# Patient Record
Sex: Male | Born: 1995 | Race: White | Hispanic: No | Marital: Single | Smoking: Never smoker
Health system: Southern US, Community
[De-identification: ages and names within clinical notes are randomized; demographics above are authoritative.]

## PROBLEM LIST (undated history)

## (undated) HISTORY — PX: WISDOM TOOTH EXTRACTION: SHX21

## (undated) HISTORY — PX: TONSILLECTOMY: SUR1361

---
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
## 2014-07-26 ENCOUNTER — Encounter (HOSPITAL_COMMUNITY): Payer: Self-pay | Admitting: Emergency Medicine

## 2014-07-26 ENCOUNTER — Emergency Department (HOSPITAL_COMMUNITY): Payer: Medicaid Other

## 2014-07-26 ENCOUNTER — Emergency Department (HOSPITAL_COMMUNITY)
Admission: EM | Admit: 2014-07-26 | Discharge: 2014-07-26 | Disposition: A | Discharge status: Home / Self Care | Payer: Medicaid Other | Attending: Emergency Medicine | Admitting: Emergency Medicine

## 2014-07-26 DIAGNOSIS — W2101XA Struck by football, initial encounter: Secondary | ICD-10-CM | POA: Diagnosis not present

## 2014-07-26 DIAGNOSIS — S62620A Displaced fracture of medial phalanx of right index finger, initial encounter for closed fracture: Secondary | ICD-10-CM | POA: Insufficient documentation

## 2014-07-26 DIAGNOSIS — Y9289 Other specified places as the place of occurrence of the external cause: Secondary | ICD-10-CM | POA: Diagnosis not present

## 2014-07-26 DIAGNOSIS — Z7982 Long term (current) use of aspirin: Secondary | ICD-10-CM | POA: Insufficient documentation

## 2014-07-26 DIAGNOSIS — Z79899 Other long term (current) drug therapy: Secondary | ICD-10-CM | POA: Diagnosis not present

## 2014-07-26 DIAGNOSIS — S62629A Displaced fracture of medial phalanx of unspecified finger, initial encounter for closed fracture: Secondary | ICD-10-CM

## 2014-07-26 DIAGNOSIS — Y9389 Activity, other specified: Secondary | ICD-10-CM | POA: Insufficient documentation

## 2014-07-26 DIAGNOSIS — Y998 Other external cause status: Secondary | ICD-10-CM | POA: Insufficient documentation

## 2014-07-26 DIAGNOSIS — S6991XA Unspecified injury of right wrist, hand and finger(s), initial encounter: Secondary | ICD-10-CM | POA: Diagnosis present

## 2014-07-26 NOTE — Discharge Instructions (Signed)

## 2014-07-26 NOTE — ED Notes (Signed)
Pt verbalizes understanding of d/c instructions and denies any further need at this time. 

## 2014-07-26 NOTE — ED Provider Notes (Signed)
CSN: [id number]     Arrival date & time 07/26/14  [id number] History   This chart was scribed for Roxy Horsemanobert Christipher Rieger, PA-C working with Cathren LaineKevin Steinl, MD by Evon Slackerrance Branch, ED Scribe. This patient was seen in room TR05C/TR05C and the patient's care was started at 6:46 PM.      Chief Complaint  Patient presents with  . Finger Injury   The history is provided by the patient. No language interpreter was used.   HPI Comments: Gabriel Finley is a 19 y.o. male who presents to the Emergency Department complaining of right index finger onset today. Pt states that he injured the finger today when catching a football. Pt doesn't report any medications PTA. Pt states that the pain is worse with movement. Pt doesn't report numbness or tingling.   History reviewed. No pertinent past medical history. Past Surgical History  Procedure Laterality Date  . Tonsillectomy    . Wisdom tooth extraction     No family history on file. History  Substance Use Topics  . Smoking status: Never Smoker   . Smokeless tobacco: Not on file  . Alcohol Use: No    Review of Systems  Musculoskeletal: Positive for arthralgias.  Neurological: Negative for numbness.      Allergies  Red dye  Home Medications   Prior to Admission medications   Medication Sig Start Date End Date Taking? Authorizing Provider  acetaminophen (TYLENOL) 500 MG tablet Take 500 mg by mouth every 6 (six) hours as needed for pain.    Historical Provider, MD  aspirin 81 MG tablet Take 81 mg by mouth daily as needed for pain.    Historical Provider, MD  nadolol (CORGARD) 20 MG tablet Take 20 mg by mouth daily.    Historical Provider, MD  promethazine (PHENERGAN) 25 MG tablet Take 25 mg by mouth every 6 (six) hours as needed for nausea.    Historical Provider, MD  simethicone (MYLICON) 80 MG chewable tablet Chew 80 mg by mouth every 6 (six) hours as needed for flatulence.    Historical Provider, MD  SUMAtriptan (IMITREX) 25 MG tablet Take 25 mg by mouth  every 2 (two) hours as needed for migraine.    Historical Provider, MD   BP 123/62 mmHg  Pulse 88  Temp(Src) 98.3 F (36.8 C) (Oral)  Resp 18  Ht 6' (1.829 m)  Wt 284 lb 4.8 oz (128.958 kg)  BMI 38.55 kg/m2   Physical Exam  Constitutional: He is oriented to person, place, and time. He appears well-developed and well-nourished. No distress.  HENT:  Head: Normocephalic and atraumatic.  Eyes: Conjunctivae and EOM are normal.  Neck: Neck supple. No tracheal deviation present.  Cardiovascular: Normal rate and intact distal pulses.   Intact distal pulses and brisk capillary refill.   Pulmonary/Chest: Effort normal. No respiratory distress.  Musculoskeletal: Normal range of motion. He exhibits tenderness.  Right index finger midly tender to palpation at the PIP joint, ROM and strength 5/5.   Neurological: He is alert and oriented to person, place, and time.  Sensation intact.   Skin: Skin is warm and dry.  Psychiatric: He has a normal mood and affect. His behavior is normal.  Nursing note and vitals reviewed.   ED Course  Procedures (including critical care time) DIAGNOSTIC STUDIES:   COORDINATION OF CARE: 6:48 PM-Discussed treatment plan with pt at bedside and pt agreed to plan.     Labs Review Labs Reviewed - No data to display  Imaging Review Dg Finger  Index Right  07/26/2014   CLINICAL DATA:  Injured index finger catching football. Index finger pain. Initial encounter.  EXAM: RIGHT INDEX FINGER 2+V  COMPARISON:  None.  FINDINGS: A small ossific density is seen along the volar plate of the middle phalanx adjacent to the proximal interphalangeal joint, which is consistent with a volar plate avulsion fracture but is of indeterminate age radiographically. No other fractures are identified. No evidence of dislocation.  IMPRESSION: Volar plate avulsion fracture involving the middle phalanx adjacent to the PIP joint, which is of indeterminate age radiographically. Recommend clinical  correlation for point tenderness at this site.   Electronically Signed   By: Myles Rosenthal M.D.   On: 07/26/2014 19:25     EKG Interpretation None      MDM   Final diagnoses:  Avulsion fracture of middle phalanx of finger, closed, initial encounter   Finger splint.  F/u with ortho.  I personally performed the services described in this documentation, which was scribed in my presence. The recorded information has been reviewed and is accurate.       Roxy Horseman, PA-C 07/26/14 2216  Cathren Laine, MD 07/31/14 [phone]

## 2014-07-26 NOTE — ED Notes (Signed)
Pt reports he injured his R index finger catching a football today.

## 2016-01-25 ENCOUNTER — Emergency Department (HOSPITAL_COMMUNITY)
Admission: EM | Admit: 2016-01-25 | Discharge: 2016-01-26 | Disposition: A | Discharge status: Home / Self Care | Payer: Self-pay | Attending: Emergency Medicine | Admitting: Emergency Medicine

## 2016-01-25 ENCOUNTER — Emergency Department (HOSPITAL_COMMUNITY): Payer: Self-pay

## 2016-01-25 ENCOUNTER — Encounter (HOSPITAL_COMMUNITY): Payer: Self-pay

## 2016-01-25 DIAGNOSIS — Y92009 Unspecified place in unspecified non-institutional (private) residence as the place of occurrence of the external cause: Secondary | ICD-10-CM | POA: Insufficient documentation

## 2016-01-25 DIAGNOSIS — Z7982 Long term (current) use of aspirin: Secondary | ICD-10-CM | POA: Insufficient documentation

## 2016-01-25 DIAGNOSIS — Y9301 Activity, walking, marching and hiking: Secondary | ICD-10-CM | POA: Insufficient documentation

## 2016-01-25 DIAGNOSIS — Y999 Unspecified external cause status: Secondary | ICD-10-CM | POA: Insufficient documentation

## 2016-01-25 DIAGNOSIS — W228XXA Striking against or struck by other objects, initial encounter: Secondary | ICD-10-CM | POA: Insufficient documentation

## 2016-01-25 DIAGNOSIS — S060X0A Concussion without loss of consciousness, initial encounter: Secondary | ICD-10-CM | POA: Insufficient documentation

## 2016-01-25 DIAGNOSIS — S0003XA Contusion of scalp, initial encounter: Secondary | ICD-10-CM | POA: Insufficient documentation

## 2016-01-25 DIAGNOSIS — Z79899 Other long term (current) drug therapy: Secondary | ICD-10-CM | POA: Insufficient documentation

## 2016-01-25 MED ORDER — ACETAMINOPHEN 500 MG PO TABS
1000.0000 mg | ORAL_TABLET | Freq: Once | ORAL | Status: AC
  Administered 2016-01-25: 1000 mg via ORAL
  Filled 2016-01-25: qty 2

## 2016-01-25 MED ORDER — ONDANSETRON 4 MG PO TBDP
8.0000 mg | ORAL_TABLET | Freq: Once | ORAL | Status: AC
Start: 2016-01-25 — End: 2016-01-25
  Administered 2016-01-25: 8 mg via ORAL
  Filled 2016-01-25: qty 2

## 2016-01-25 NOTE — ED Notes (Addendum)
Patient brought in by mother for evaluation following pt hitting his head last night about 7pm on pantry door edge. Pt's mother sts he had a bump last night that has gone down in size today, small hematoma noted to top of R side of head. Mother sts pt has "not been acting himself, he's usually more energetic and he seems to be depressed today." C/o dizziness upon movement and nausea. Denies vomiting or LOC. A&O x 4.

## 2016-01-25 NOTE — ED Triage Notes (Signed)
Last night pt hit his head on the edge of a food pantry; has bruise to right side of head. Mom reports pt has been nauseous and more tired than normal and also reports headache since the head injury, "hes not his normal self."  A&OX4.

## 2016-01-25 NOTE — ED Provider Notes (Signed)
[facility]EMERGENCY DEPT Provider Note   CSN: [id number] Arrival date & time: 01/25/16  1546     History   Chief Complaint Chief Complaint  Patient presents with  . Head Injury    HPI Gabriel Finley is a 21 y.o. male.  HPI   Patient is a 21 year old male with no pertinent past medical history presents to the ED with complaint of head injury, onset yesterday afternoon. Patient reports when he was walking out of their storage closet under their stairs at home he hit his head on the metal cabinet shelf. Denies LOC. Patient reports having associated headache to the top of his head with small area of swelling to the top of his scalp. Patient states the headache has been constant pressure/tightness since onset and has gradually worsened this afternoon. Endorses associated nausea, fatigue and intermittent lightheadedness. Patient's mother reports he has seemed more tired and has been wanting to sleep during the day which is unusual for him. Patient reports taking Tylenol with mild intermittent improvement of symptoms. Denies fever, visual changes, neck pain/stiffness, abdominal pain, vomiting, numbness, tingling, weakness.   History reviewed. No pertinent past medical history.  There are no active problems to display for this patient.   Past Surgical History:  Procedure Laterality Date  . TONSILLECTOMY    . WISDOM TOOTH EXTRACTION         Home Medications    Prior to Admission medications   Medication Sig Start Date End Date Taking? Authorizing Provider  acetaminophen (TYLENOL) 500 MG tablet Take 500 mg by mouth every 6 (six) hours as needed for pain.    Historical Provider, MD  aspirin 81 MG tablet Take 81 mg by mouth daily as needed for pain.    Historical Provider, MD  nadolol (CORGARD) 20 MG tablet Take 20 mg by mouth daily.    Historical Provider, MD  promethazine (PHENERGAN) 25 MG tablet Take 25 mg by mouth every 6 (six) hours as needed for nausea.    Historical Provider, MD    simethicone (MYLICON) 80 MG chewable tablet Chew 80 mg by mouth every 6 (six) hours as needed for flatulence.    Historical Provider, MD  SUMAtriptan (IMITREX) 25 MG tablet Take 25 mg by mouth every 2 (two) hours as needed for migraine.    Historical Provider, MD    Family History No family history on file.  Social History Social History  Substance Use Topics  . Smoking status: Never Smoker  . Smokeless tobacco: Never Used  . Alcohol use No     Allergies   Red dye   Review of Systems Review of Systems  Constitutional: Positive for fatigue.  Gastrointestinal: Positive for nausea.  Neurological: Positive for light-headedness and headaches.     Physical Exam Updated Vital Signs BP 124/58 (BP Location: Right Arm)   Pulse 64   Temp 98.1 F (36.7 C) (Oral)   Resp 12   Ht 6\' 1"  (1.854 m)   Wt 122.5 kg   SpO2 98%   BMI 35.62 kg/m   Physical Exam  Constitutional: He is oriented to person, place, and time. He appears well-developed and well-nourished.  HENT:  Head: Normocephalic. Head is with contusion. Head is without raccoon's eyes, without Battle's sign, without abrasion and without laceration.    Right Ear: Tympanic membrane normal. No hemotympanum.  Left Ear: Tympanic membrane normal. No hemotympanum.  Nose: Nose normal. No rhinorrhea, nose lacerations, sinus tenderness, nasal deformity, septal deviation or nasal septal hematoma. No epistaxis.  Mouth/Throat:  Uvula is midline, oropharynx is clear and moist and mucous membranes are normal. No oropharyngeal exudate, posterior oropharyngeal edema, posterior oropharyngeal erythema or tonsillar abscesses. No tonsillar exudate.  Small hematoma noted to right parietal scalp  Eyes: Conjunctivae and EOM are normal. Pupils are equal, round, and reactive to light. Right eye exhibits no discharge. Left eye exhibits no discharge. No scleral icterus.  Neck: Normal range of motion. Neck supple.  Cardiovascular: Normal rate, regular  rhythm, normal heart sounds and intact distal pulses.   Pulmonary/Chest: Effort normal and breath sounds normal. No respiratory distress. He has no wheezes. He has no rales. He exhibits no tenderness.  Abdominal: Soft. Bowel sounds are normal. He exhibits no distension and no mass. There is no tenderness. There is no rebound and no guarding. No hernia.  Musculoskeletal: Normal range of motion. He exhibits no edema.  No midline C, T, or L tenderness. Full range of motion of neck and back. Full range of motion of bilateral upper and lower extremities, with 5/5 strength. Sensation intact. Cap refill <2 seconds. Patient able to stand and ambulate without assistance, no ataxia noted.    Neurological: He is alert and oriented to person, place, and time.  Skin: Skin is warm and dry.  Nursing note and vitals reviewed.    ED Treatments / Results  Labs (all labs ordered are listed, but only abnormal results are displayed) Labs Reviewed - No data to display  EKG  EKG Interpretation None       Radiology Ct Head Wo Contrast  Result Date: 01/25/2016 CLINICAL DATA:  Laceration on top of head. Patient hit head against cabinet. EXAM: CT HEAD WITHOUT CONTRAST TECHNIQUE: Contiguous axial images were obtained from the base of the skull through the vertex without intravenous contrast. COMPARISON:  None. FINDINGS: BRAIN: The ventricles and sulci are normal. No intraparenchymal hemorrhage, mass effect nor midline shift. No acute large vascular territory infarcts. Grey-white matter distinction is maintained. The basal ganglia are unremarkable. No abnormal extra-axial fluid collections. Basal cisterns arenot effaced and midline. The brainstem and cerebellar hemispheres are without acute abnormalities. VASCULAR: Unremarkable. SKULL/SOFT TISSUES: No skull fracture. No significant soft tissue swelling. ORBITS/SINUSES: The included ocular globes and orbital contents are normal.The mastoid air cells are clear. The  included paranasal sinuses are well-aerated. OTHER: Tiny scalp calcification near the vertex of the skull of doubtful clinical significance. IMPRESSION: No acute intracranial abnormality. No fracture. No radiopaque foreign body. Electronically Signed   By: Tollie Eth M.D.   On: 01/25/2016 23:32    Procedures Procedures (including critical care time)  Medications Ordered in ED Medications  acetaminophen (TYLENOL) tablet 1,000 mg (1,000 mg Oral Given 01/25/16 2253)  ondansetron (ZOFRAN-ODT) disintegrating tablet 8 mg (8 mg Oral Given 01/25/16 2301)     Initial Impression / Assessment and Plan / ED Course  I have reviewed the triage vital signs and the nursing notes.  Pertinent labs & imaging results that were available during my care of the patient were reviewed by me and considered in my medical decision making (see chart for details).  Clinical Course     Patient presents with head injury that occurred due to hitting his head on a metal cabinet yesterday afternoon. Denies LOC. Reports having continued gradually worsening headache with associated intermittent nausea, lightheadedness and fatigue. VSS. Exam revealed small hematoma right parietal region. No other evidence of head injury or trauma. No neuro deficits. Patient able to stand and ambulate without ataxia. Due to patient with continued worsening  headache with associated nausea, fatigue and change in behavior will order head CT for further evaluation. CT head unremarkable. Plan to discharge patient home with symptomatic treatment and head injury precautions. Advised patient to follow up with PCP as needed. Discussed return precautions.  Final Clinical Impressions(s) / ED Diagnoses   Final diagnoses:  Concussion without loss of consciousness, initial encounter    New Prescriptions Discharge Medication List as of 01/25/2016 11:59 PM       Barrett HenleNicole Elizabeth Jerauld Bostwick, PA-C 01/26/16 0032    Azalia BilisKevin Campos, MD 01/26/16 [phone]

## 2016-01-25 NOTE — ED Notes (Signed)
PA-C assessing patient at this time.

## 2016-01-25 NOTE — ED Notes (Signed)
Pt returned from CT °

## 2016-01-25 NOTE — ED Notes (Signed)
Pt states pain and nausea now some better.

## 2016-01-25 NOTE — Discharge Instructions (Signed)
I recommend taking Tylenol as prescribed over-the-counter as seen for additional pain relief. I also recommend continuing to drink fluids at home resting for the next few days and her symptoms have improved. I recommend refraining from watching TV or playing video games for the next few days to prevent worsening of symptoms. Follow-up with your primary care provider in the next 3-4 days if your symptoms have not improved or worsened. Please return to the Emergency Department if symptoms worsen or new onset of fever, lightheadedness, dizziness, visual changes, worsening headache, neck stiffness, vomiting, urinary or bowel incontinence, weakness, numbness, restlessness, unable to awaken, confusion, somnolence.

## 2016-01-26 NOTE — ED Notes (Signed)
Pt and mother verbalized understanding of d/c instructions. Pt ambulatory with steady gait to waiting area.

## 2016-05-20 ENCOUNTER — Emergency Department (HOSPITAL_COMMUNITY)
Admission: EM | Admit: 2016-05-20 | Discharge: 2016-05-20 | Disposition: A | Discharge status: Home / Self Care | Payer: Self-pay | Attending: Emergency Medicine | Admitting: Emergency Medicine

## 2016-05-20 ENCOUNTER — Emergency Department (HOSPITAL_COMMUNITY): Payer: Self-pay

## 2016-05-20 DIAGNOSIS — R252 Cramp and spasm: Secondary | ICD-10-CM | POA: Insufficient documentation

## 2016-05-20 DIAGNOSIS — E860 Dehydration: Secondary | ICD-10-CM | POA: Insufficient documentation

## 2016-05-20 LAB — BASIC METABOLIC PANEL
ANION GAP: 10 (ref 5–15)
BUN: 14 mg/dL (ref 6–20)
CALCIUM: 9.1 mg/dL (ref 8.9–10.3)
CO2: 23 mmol/L (ref 22–32)
CREATININE: 1.07 mg/dL (ref 0.61–1.24)
Chloride: 102 mmol/L (ref 101–111)
Glucose, Bld: 72 mg/dL (ref 65–99)
Potassium: 3.7 mmol/L (ref 3.5–5.1)
Sodium: 135 mmol/L (ref 135–145)

## 2016-05-20 LAB — CBC WITH DIFFERENTIAL/PLATELET
BASOS ABS: 0 10*3/uL (ref 0.0–0.1)
BASOS PCT: 0 %
EOS ABS: 0.2 10*3/uL (ref 0.0–0.7)
EOS PCT: 2 %
HCT: 40.9 % (ref 39.0–52.0)
Hemoglobin: 14.1 g/dL (ref 13.0–17.0)
Lymphocytes Relative: 16 %
Lymphs Abs: 1.5 10*3/uL (ref 0.7–4.0)
MCH: 29.8 pg (ref 26.0–34.0)
MCHC: 34.5 g/dL (ref 30.0–36.0)
MCV: 86.5 fL (ref 78.0–100.0)
MONO ABS: 0.8 10*3/uL (ref 0.1–1.0)
MONOS PCT: 9 %
Neutro Abs: 6.8 10*3/uL (ref 1.7–7.7)
Neutrophils Relative %: 73 %
PLATELETS: 233 10*3/uL (ref 150–400)
RBC: 4.73 MIL/uL (ref 4.22–5.81)
RDW: 12.2 % (ref 11.5–15.5)
WBC: 9.3 10*3/uL (ref 4.0–10.5)

## 2016-05-20 LAB — I-STAT TROPONIN, ED: Troponin i, poc: 0 ng/mL (ref 0.00–0.08)

## 2016-05-20 LAB — CK: Total CK: 496 U/L — ABNORMAL HIGH (ref 49–397)

## 2016-05-20 LAB — MAGNESIUM: MAGNESIUM: 1.8 mg/dL (ref 1.7–2.4)

## 2016-05-20 MED ORDER — SODIUM CHLORIDE 0.9 % IV BOLUS (SEPSIS)
1000.0000 mL | Freq: Once | INTRAVENOUS | Status: AC
  Administered 2016-05-20: 1000 mL via INTRAVENOUS

## 2016-05-20 NOTE — ED Notes (Signed)
Patient states he went to seek assistance at [facility] today and they were asking him to fill out paperwork which he did not want to fill out, states they were unwilling to help him due to the fact that "I didn't want to fill out the paperwork".  Patient has an abrasion and singe mark noted to the left forearm.  Patient states he has not been able to fill any medications provided for him from [facility]  Patient states he now wants inpatient long term treatment.

## 2016-05-20 NOTE — ED Notes (Signed)
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 Patient states is still having body aches and chest tightness but shortness of breath has dissipated.

## 2016-05-20 NOTE — ED Provider Notes (Signed)
[facility] DEPT Provider Note   CSN: [id number] Arrival date & time: 05/20/16  1731     History   Chief Complaint Chief Complaint  Patient presents with  . Dehydration    HPI Gabriel Finley is a 21 y.o. male.  The history is provided by the patient and medical records.    21 year old male with history of IBS here after a syncopal event. 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 States this was his fourth day of work consecutively, reports that shift today was about 6 hours long. States when leaving work he went to go me his mother and was standing waiting for her resting against phone call, mom reports she saw him and he appeared weak and then collapsed. Patient reports he has drank about 8 bottles of water today, but still feels a hydrated. States he has diffuse bodily cramping throughout his entire body. States he was having some chest tightness and SOB, better at this time.  No recent illness, fever, chills, sweats.  Mom states she has been giving him magnesium and potassium supplements daily to help prevent muscle cramps.  No past medical history on file.  There are no active problems to display for this patient.   Past Surgical History:  Procedure Laterality Date  . TONSILLECTOMY    . WISDOM TOOTH EXTRACTION         Home Medications    Prior to Admission medications   Medication Sig Start Date End Date Taking? Authorizing Provider  Coenzyme Q10 (CO Q 10 PO) Take 1 capsule by mouth 2 (two) times daily.   Yes Historical Provider, MD  ibuprofen (ADVIL,MOTRIN) 200 MG tablet Take 1,000 mg by mouth every 6 (six) hours as needed for headache or moderate pain.   Yes Historical Provider, MD  Red Yeast Rice Extract (RED YEAST RICE PO) Take 1 capsule by mouth 2 (two) times daily.   Yes Historical Provider, MD    Family History No family history on file.  Social History Social History  Substance Use Topics  . Smoking  status: Never Smoker  . Smokeless tobacco: Never Used  . Alcohol use No     Allergies   Nadolol; Imitrex [sumatriptan]; Morphine and related; and Red dye   Review of Systems Review of Systems  Respiratory: Positive for shortness of breath.   Cardiovascular: Positive for chest pain.  Musculoskeletal: Positive for myalgias.  All other systems reviewed and are negative.    Physical Exam Updated Vital Signs BP (!) 115/55   Pulse 88   Temp 98.4 F (36.9 C) (Oral)   Resp 20   Ht  (1.88 m)   Wt 117.9 kg   SpO2 97%   BMI 33.38 kg/m   Physical Exam  Constitutional: He is oriented to person, place, and time. He appears well-developed and well-nourished.  HENT:  Head: Normocephalic and atraumatic.  Mouth/Throat: Oropharynx is clear and moist.  Eyes: Conjunctivae and EOM are normal. Pupils are equal, round, and reactive to light.  Neck: Normal range of motion.  Cardiovascular: Normal rate, regular rhythm and normal heart sounds.   Pulmonary/Chest: Effort normal and breath sounds normal. No respiratory distress. He has no wheezes.  Abdominal: Soft. Bowel sounds are normal. There is no tenderness. There is no rebound.  Musculoskeletal: Normal range of motion.  Extremities are atraumatic, no areas of swelling, or bony deformities, normal strength/sensation throughout  Neurological: He is alert and oriented to person, place,  and time.  Skin: Skin is warm and dry.  Psychiatric: He has a normal mood and affect.  Nursing note and vitals reviewed.    ED Treatments / Results  Labs (all labs ordered are listed, but only abnormal results are displayed) Labs Reviewed  CK - Abnormal; Notable for the following:       Result Value   Total CK 496 (*)    All other components within normal limits  CBC WITH DIFFERENTIAL/PLATELET  BASIC METABOLIC PANEL  MAGNESIUM  I-STAT TROPOININ, ED    EKG  EKG Interpretation  Date/Time:  Tuesday May 20 2016 17:35:20 EDT Ventricular Rate:    96 PR Interval:    QRS Duration: 85 QT Interval:  340 QTC Calculation: 430 R Axis:   77 Text Interpretation:  Sinus rhythm ST elev, probable normal early repol pattern No significant change since last tracing Confirmed by FLOYD MD, DANIEL [phone]) on 05/20/2016 6:12:33 PM       Radiology Dg Chest 2 View  Result Date: 05/20/2016 CLINICAL DATA:  Shortness of breath with dehydration and chest pain. Syncope. EXAM: CHEST  2 VIEW COMPARISON:  02/19/2011 FINDINGS: The heart size and mediastinal contours are within normal limits. Both lungs are clear. The visualized skeletal structures are unremarkable. IMPRESSION: No active cardiopulmonary disease. Electronically Signed   By: Elberta Fortis M.D.   On: 05/20/2016 19:01    Procedures Procedures (including critical care time)  Medications Ordered in ED Medications  sodium chloride 0.9 % bolus 1,000 mL (0 mLs Intravenous Stopped 05/20/16 2000)  sodium chloride 0.9 % bolus 1,000 mL (0 mLs Intravenous Stopped 05/20/16 2127)     Initial Impression / Assessment and Plan / ED Course  I have reviewed the triage vital signs and the nursing notes.  Pertinent labs & imaging results that were available during my care of the patient were reviewed by me and considered in my medical decision making (see chart for details).  21 y.o. F here with concern of dehydration and syncopal event.  He does report working for [employer] in [employer] with limited breaks.  He is afebrile, non-toxic.  Extremities are atraumatic, no swelling or deformities.  Does report generalized cramping.  Will plan for labs including CK, magnesium, CXR, EKG.  IVF started.  Patient tolerating PO at present.  EKG nonischemic. X-ray is clear. Labwork as above, CK is elevated at 496 consistent with a mild state of rhabdomyolysis. Patient's renal function is preserved. Patient was fluid resuscitated here with 2 L saline, continues tolerating PO simultanesouly.  Given that he is young and otherwise  healthy with normal renal function currently and CK less than 500, feel as though this can be managed further at home. Will have him continue oral hydration. Follow-up with his primary care doctor for recheck of labs within the next week.  Discussed plan with patient, he/she acknowledged understanding and agreed with plan of care.  Return precautions given for new or worsening symptoms.  Case discussed with attending physician, Dr. Adela Lank, who agrees with plan of care.  Final Clinical Impressions(s) / ED Diagnoses   Final diagnoses:  Dehydration  Muscle cramps    New Prescriptions Discharge Medication List as of 05/20/2016  9:19 PM       Garlon Hatchet, PA-C 05/20/16 2147    Melene Plan, DO 05/20/16 2316

## 2016-05-20 NOTE — ED Notes (Signed)
Pt to xray

## 2016-05-20 NOTE — Discharge Instructions (Signed)
As we discussed, your lab work today did show evidence of some muscle breakdown. This is likely from the heat and component of dehydration. It is very important that you continue oral fluids at home. I recommend that you follow-up with your primary care doctor next week to have your lab work rechecked. Return to the ED for new or worsening symptoms.

## 2016-05-20 NOTE — ED Triage Notes (Signed)
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 Pt states he haas had lots to drink at least 8 bottles of water states was given a potassium and magnesium suppliment this am by his mom . States has been working like this x 4 days pt was given 500 cc  Ns per ems

## 2016-10-17 ENCOUNTER — Encounter (HOSPITAL_COMMUNITY): Payer: Self-pay | Admitting: Emergency Medicine

## 2016-10-17 ENCOUNTER — Ambulatory Visit (HOSPITAL_COMMUNITY)
Admission: EM | Admit: 2016-10-17 | Discharge: 2016-10-17 | Disposition: A | Discharge status: Home / Self Care | Payer: Self-pay | Attending: Internal Medicine | Admitting: Internal Medicine

## 2016-10-17 DIAGNOSIS — J069 Acute upper respiratory infection, unspecified: Secondary | ICD-10-CM | POA: Insufficient documentation

## 2016-10-17 DIAGNOSIS — R631 Polydipsia: Secondary | ICD-10-CM | POA: Insufficient documentation

## 2016-10-17 DIAGNOSIS — R110 Nausea: Secondary | ICD-10-CM | POA: Insufficient documentation

## 2016-10-17 DIAGNOSIS — Z79899 Other long term (current) drug therapy: Secondary | ICD-10-CM | POA: Insufficient documentation

## 2016-10-17 LAB — GLUCOSE, CAPILLARY: Glucose-Capillary: 88 mg/dL (ref 65–99)

## 2016-10-17 LAB — POCT RAPID STREP A: STREPTOCOCCUS, GROUP A SCREEN (DIRECT): NEGATIVE

## 2016-10-17 MED ORDER — ONDANSETRON HCL 4 MG PO TABS: 4.0000 mg | ORAL_TABLET | Freq: Four times a day (QID) | ORAL | 0 refills | Status: AC

## 2016-10-17 NOTE — ED Triage Notes (Signed)
Pt here for cold sx onset yest associated w/ST, teeth pain, diaphoresis, HA, cough  A&O x4... NAD... Ambulatory

## 2016-10-17 NOTE — ED Provider Notes (Signed)
[facility]    CSN: [id number] Arrival date & time: 10/17/16  1429     History   Chief Complaint Chief Complaint  Patient presents with  . URI    HPI Gabriel Finley is a 21 y.o. male.   21  Yo caucasian male who presents with sore throat, subjective fevers, nausea, sinus drainage and overall fatigue that began yesterday. No known exposures. No cough. Also polydipsia started yesterday and Mother would like a CBG given family history. No prior polydipsia when feeling well otherwise.       History reviewed. No pertinent past medical history.  There are no active problems to display for this patient.   Past Surgical History:  Procedure Laterality Date  . TONSILLECTOMY    . WISDOM TOOTH EXTRACTION         Home Medications    Prior to Admission medications   Medication Sig Start Date End Date Taking? Authorizing Provider  Coenzyme Q10 (CO Q 10 PO) Take 1 capsule by mouth 2 (two) times daily.    [provider]  ibuprofen (ADVIL,MOTRIN) 200 MG tablet Take 1,000 mg by mouth every 6 (six) hours as needed for headache or moderate pain.    [provider]  ondansetron (ZOFRAN) 4 MG tablet Take 1 tablet (4 mg total) by mouth every 6 (six) hours. 10/17/16   Riki Sheer, PA-C  Red Yeast Rice Extract (RED YEAST RICE PO) Take 1 capsule by mouth 2 (two) times daily.    [provider]    Family History History reviewed. No pertinent family history.  Social History Social History  Substance Use Topics  . Smoking status: Never Smoker  . Smokeless tobacco: Never Used  . Alcohol use No     Allergies   Nadolol; Imitrex [sumatriptan]; Morphine and related; and Red dye   Review of Systems Review of Systems  Constitutional: Positive for fatigue. Negative for chills and fever.  HENT: Positive for congestion and postnasal drip.   Eyes: Negative.   Respiratory: Negative for cough.   Skin: Negative.   Allergic/Immunologic:  Negative.   Neurological: Negative.   Psychiatric/Behavioral: Negative.      Physical Exam Triage Vital Signs ED Triage Vitals  Enc Vitals Group     BP 10/17/16 1511 (!) 116/58     Pulse Rate 10/17/16 1511 68     Resp 10/17/16 1511 20     Temp 10/17/16 1511 98.5 F (36.9 C)     Temp Source 10/17/16 1511 Oral     SpO2 10/17/16 1511 98 %     Weight --      Height --      Head Circumference --      Peak Flow --      Pain Score 10/17/16 1512 6     Pain Loc --      Pain Edu? --      Excl. in GC? --    No data found.   Updated Vital Signs BP (!) 116/58 (BP Location: Left Arm)   Pulse 68   Temp 98.5 F (36.9 C) (Oral)   Resp 20   SpO2 98%   Visual Acuity Right Eye Distance:   Left Eye Distance:   Bilateral Distance:    Right Eye Near:   Left Eye Near:    Bilateral Near:     Physical Exam  Constitutional: He is oriented to person, place, and time. He appears well-developed and well-nourished. No distress.  HENT:  Right  Ear: External ear normal.  Left Ear: External ear normal.  Oropharynx with mild injection no exudate, nasal septum with mild drainage, no erythema  Cardiovascular: Normal rate and regular rhythm.   Pulmonary/Chest: Effort normal and breath sounds normal.  Lymphadenopathy:    He has no cervical adenopathy.  Neurological: He is alert and oriented to person, place, and time.  Skin: Skin is warm and dry. He is not diaphoretic.  Psychiatric: His behavior is normal.  Nursing note and vitals reviewed.    UC Treatments / Results  Labs (all labs ordered are listed, but only abnormal results are displayed) Labs Reviewed  CULTURE, GROUP A STREP [facility])  GLUCOSE, CAPILLARY  POCT RAPID STREP A    EKG  EKG Interpretation None       Radiology No results found.  Procedures Procedures (including critical care time)  Medications Ordered in UC Medications - No data to display   Initial Impression / Assessment and Plan / UC Course  I have  reviewed the triage vital signs and the nursing notes.  Pertinent labs & imaging results that were available during my care of the patient were reviewed by me and considered in my medical decision making (see chart for details).     No indication for an antibiotic at this time. Strep negative. Treat symptomatically with OTC pain reliever ands decongestants. May use Ibuprofen as needed. Push fluids. Zofran provided for nausea. CBG's checked in setting of polydipsia and requested by Mother due to family history. This was normal. FU here or with PCP if not resolving.   Final Clinical Impressions(s) / UC Diagnoses   Final diagnoses:  Viral upper respiratory tract infection  Nausea without vomiting  Polydipsia    New Prescriptions Discharge Medication List as of 10/17/2016  4:42 PM    START taking these medications   Details  ondansetron (ZOFRAN) 4 MG tablet Take 1 tablet (4 mg total) by mouth every 6 (six) hours., Starting Fri 10/17/2016, Print         Controlled Substance Prescriptions [place] Controlled Substance Registry consulted? Not Applicable   Sharin Mons 10/17/16 1710

## 2016-10-17 NOTE — Discharge Instructions (Signed)
Nice to meet you. At this time time evidence of a bacterial infection only viral. Treat your symptoms with ibuprofen  every 8 hours, may use Flonase for congestion and sudafed if needed. Zofran will help with nausea. Feel better and stay hydrated.

## 2016-10-20 LAB — CULTURE, GROUP A STREP (THRC)

## 2017-03-07 ENCOUNTER — Encounter (HOSPITAL_COMMUNITY): Payer: Self-pay

## 2017-03-07 ENCOUNTER — Emergency Department (HOSPITAL_COMMUNITY): Payer: Self-pay

## 2017-03-07 ENCOUNTER — Emergency Department (HOSPITAL_COMMUNITY)
Admission: EM | Admit: 2017-03-07 | Discharge: 2017-03-07 | Disposition: A | Discharge status: Home / Self Care | Payer: Self-pay | Attending: Emergency Medicine | Admitting: Emergency Medicine

## 2017-03-07 DIAGNOSIS — G8929 Other chronic pain: Secondary | ICD-10-CM | POA: Insufficient documentation

## 2017-03-07 DIAGNOSIS — Z79899 Other long term (current) drug therapy: Secondary | ICD-10-CM | POA: Insufficient documentation

## 2017-03-07 DIAGNOSIS — M545 Low back pain: Secondary | ICD-10-CM | POA: Insufficient documentation

## 2017-03-07 MED ORDER — CYCLOBENZAPRINE HCL 10 MG PO TABS: 10.0000 mg | ORAL_TABLET | Freq: Two times a day (BID) | ORAL | 0 refills | Status: AC | PRN

## 2017-03-07 NOTE — Discharge Instructions (Addendum)
Please continue Ibuprofen and Tylenol for pain Continue Lidocaine cream and tiger balm Take Flexeril as needed for muscle pain. This medication can make you sleepy Follow up with Orthopedics

## 2017-03-07 NOTE — ED Triage Notes (Signed)
Patient complains of ongoing lower back pain since fall while going down steps last may. Pain with any ROM, NAD

## 2017-03-07 NOTE — ED Notes (Signed)
Patient transported to X-ray 

## 2017-03-07 NOTE — ED Triage Notes (Signed)
PT reports he has been going on walks and taking Ibuprofen with out relief. Pt reports the back pain has been present for 5 months . Pt reports nothing relieves back pain.

## 2017-03-07 NOTE — ED Provider Notes (Signed)
[facility] EMERGENCY DEPARTMENT Provider Note   CSN: [id number] Arrival date & time: 03/07/17  1640     History   Chief Complaint Chief Complaint  Patient presents with  . Back Pain    HPI Gabriel Finley is a 22 y.o. male who presents with low back pain.  No significant past medical history.  He states that about 9 months ago he had a couple of falls while going down steps.  He fell onto his buttocks.  He did not not initially have pain therefore did not get checked out.  About 5 months ago he started having pain.  It has gradually worsened and over the past 2 months he reports the pain is been more severe.  He has pain with any movement of the back or prolonged sitting or standing.  It feels better with lying down.  He gets temporary relief with medications but the pain always comes back. No fever, syncope, unexplained weight loss, hx of cancer, loss of bowel/bladder function, saddle anesthesia, urinary retention, IVDU.   HPI  History reviewed. No pertinent past medical history.  There are no active problems to display for this patient.   Past Surgical History:  Procedure Laterality Date  . TONSILLECTOMY    . WISDOM TOOTH EXTRACTION         Home Medications    Prior to Admission medications   Medication Sig Start Date End Date Taking? Authorizing Provider  Coenzyme Q10 (CO Q 10 PO) Take 1 capsule by mouth 2 (two) times daily.    [provider]  ibuprofen (ADVIL,MOTRIN) 200 MG tablet Take 1,000 mg by mouth every 6 (six) hours as needed for headache or moderate pain.    [provider]  ondansetron (ZOFRAN) 4 MG tablet Take 1 tablet (4 mg total) by mouth every 6 (six) hours. 10/17/16   Riki Sheer, PA-C  Red Yeast Rice Extract (RED YEAST RICE PO) Take 1 capsule by mouth 2 (two) times daily.    [provider]    Family History No family history on file.  Social History Social History   Tobacco Use  . Smoking  status: Never Smoker  . Smokeless tobacco: Never Used  Substance Use Topics  . Alcohol use: No  . Drug use: No     Allergies   Nadolol; Imitrex [sumatriptan]; Morphine and related; and Red dye   Review of Systems Review of Systems  Musculoskeletal: Positive for back pain.  Skin: Negative for wound.  Neurological: Negative for weakness and numbness.     Physical Exam Updated Vital Signs BP 115/73 (BP Location: Right Arm)   Pulse 79   Temp 98.4 F (36.9 C) (Oral)   Resp 15   SpO2 97%   Physical Exam  Constitutional: He is oriented to person, place, and time. He appears well-developed and well-nourished. No distress.  HENT:  Head: Normocephalic and atraumatic.  Eyes: Conjunctivae are normal. Pupils are equal, round, and reactive to light. Right eye exhibits no discharge. Left eye exhibits no discharge. No scleral icterus.  Neck: Normal range of motion.  Cardiovascular: Normal rate.  Pulmonary/Chest: Effort normal. No respiratory distress.  Abdominal: He exhibits no distension.  Musculoskeletal:  Back: Inspection: No masses, deformity, or rash Palpation: He is non-tender however reports diffuse pain over the lower back.  ROM: Normal flexion, extension, lateral rotation and flexion of back.  Strength: 5/5 in lower extremities and normal plantar and dorsiflexion Sensation: Intact sensation with light touch in lower  extremities bilaterally Reflexes: Patellar reflex is 2+ bilaterally SLR: Negative seated straight leg raise Gait: Normal gait   Neurological: He is alert and oriented to person, place, and time.  Skin: Skin is warm and dry.  Psychiatric: He has a normal mood and affect. His behavior is normal.  Nursing note and vitals reviewed.    ED Treatments / Results  Labs (all labs ordered are listed, but only abnormal results are displayed) Labs Reviewed - No data to display  EKG  EKG Interpretation None       Radiology Dg Lumbar Spine Complete  Result  Date: 03/07/2017 CLINICAL DATA:  Fall 9 months ago with continued pain. EXAM: LUMBAR SPINE - COMPLETE 4+ VIEW COMPARISON:  None. FINDINGS: There is no evidence of lumbar spine fracture. Alignment is normal. Intervertebral disc spaces are maintained. IMPRESSION: Negative. Electronically Signed   By: Gerome Samavid  Williams III M.D   On: 03/07/2017 20:30    Procedures Procedures (including critical care time)  Medications Ordered in ED Medications - No data to display   Initial Impression / Assessment and Plan / ED Course  I have reviewed the triage vital signs and the nursing notes.  Pertinent labs & imaging results that were available during my care of the patient were reviewed by me and considered in my medical decision making (see chart for details).  22 year old with chronic back pain.  Back exam is unremarkable.  He is 5 out of 5 strength and is ambulatory.  X-ray was obtained and is negative.  He will be given prescription for muscle relaxer and advised to follow-up with orthopedics if he is not improving.  Final Clinical Impressions(s) / ED Diagnoses   Final diagnoses:  Chronic bilateral low back pain without sciatica    ED Discharge Orders    None       Bethel BornGekas, Kelly Marie, PA-C 03/08/17 0018    Alvira MondaySchlossman, Erin, MD 03/08/17 2106

## 2017-04-16 ENCOUNTER — Encounter (HOSPITAL_COMMUNITY): Payer: Self-pay | Admitting: Emergency Medicine

## 2017-04-16 ENCOUNTER — Emergency Department (HOSPITAL_COMMUNITY)
Admission: EM | Admit: 2017-04-16 | Discharge: 2017-04-16 | Disposition: A | Discharge status: Home / Self Care | Payer: Medicaid Other | Attending: Emergency Medicine | Admitting: Emergency Medicine

## 2017-04-16 ENCOUNTER — Other Ambulatory Visit: Payer: Self-pay

## 2017-04-16 DIAGNOSIS — G5601 Carpal tunnel syndrome, right upper limb: Secondary | ICD-10-CM | POA: Insufficient documentation

## 2017-04-16 MED ORDER — NAPROXEN 375 MG PO TABS: 375.0000 mg | ORAL_TABLET | Freq: Two times a day (BID) | ORAL | 0 refills | Status: AC

## 2017-04-16 MED ORDER — NAPROXEN 375 MG PO TABS: 375.0000 mg | ORAL_TABLET | Freq: Two times a day (BID) | ORAL | 0 refills | Status: DC

## 2017-04-16 NOTE — ED Provider Notes (Signed)
[facility] EMERGENCY DEPARTMENT Provider Note   CSN: [id number] Arrival date & time: 04/16/17  1423     History   Chief Complaint Chief Complaint  Patient presents with  . Hand Problem    HPI Gabriel Finley is a 22 y.o. right handed male with no significant past medical history who presents emergency department today for right hand numbness/tingling.  Patient states that 2 weeks ago he started developing numbness and tingling in his right hand including his first, second, third and radial half of the fourth digit that occurs after long periods of typing and also when the patient wakes in the morning.  The patient denies any associated pain.  No trauma.  He has not taken anything for his symptoms.  Patient denies any fever, joint swelling, overlying erythema.  HPI  History reviewed. No pertinent past medical history.  There are no active problems to display for this patient.   Past Surgical History:  Procedure Laterality Date  . TONSILLECTOMY    . WISDOM TOOTH EXTRACTION          Home Medications    Prior to Admission medications   Medication Sig Start Date End Date Taking? Authorizing Provider  Coenzyme Q10 (CO Q 10 PO) Take 1 capsule by mouth 2 (two) times daily.    [provider]  cyclobenzaprine (FLEXERIL) 10 MG tablet Take 1 tablet (10 mg total) by mouth 2 (two) times daily as needed for muscle spasms. 03/07/17   Bethel Born, PA-C  ibuprofen (ADVIL,MOTRIN) 200 MG tablet Take 1,000 mg by mouth every 6 (six) hours as needed for headache or moderate pain.    [provider]  ondansetron (ZOFRAN) 4 MG tablet Take 1 tablet (4 mg total) by mouth every 6 (six) hours. 10/17/16   Riki Sheer, PA-C  Red Yeast Rice Extract (RED YEAST RICE PO) Take 1 capsule by mouth 2 (two) times daily.    [provider]    Family History No family history on file.  Social History Social History   Tobacco Use  . Smoking status:  Never Smoker  . Smokeless tobacco: Never Used  Substance Use Topics  . Alcohol use: No  . Drug use: No     Allergies   Nadolol; Imitrex [sumatriptan]; Morphine and related; and Red dye   Review of Systems Review of Systems  All other systems reviewed and are negative.    Physical Exam Updated Vital Signs BP 132/67 (BP Location: Right Arm)   Pulse 87   Temp 98.7 F (37.1 C) (Oral)   Resp 16   Ht 6\' 4"  (1.93 m)   Wt 108.9 kg (240 lb)   SpO2 99%   BMI 29.21 kg/m   Physical Exam  Constitutional: He appears well-developed and well-nourished.  HENT:  Head: Normocephalic and atraumatic.  Right Ear: External ear normal.  Left Ear: External ear normal.  Eyes: Conjunctivae are normal. Right eye exhibits no discharge. Left eye exhibits no discharge. No scleral icterus.  Pulmonary/Chest: Effort normal. No respiratory distress.  Musculoskeletal:       Right shoulder: Normal.       Right elbow: Normal. Right hand: No gross deformities, skin intact. Fingers appear normal.  No thenar eminence atrophy.  No TTP over flexor sheath, wrist or hand. Finger adduction/abduction intact with 5/5 strength.  Thumb opposition intact. Full active and resisted ROM to flexion/extension at wrist, MCP, PIP and DIP of all fingers.  FDS/FDP intact. Radial artery 2+ with <  2sec cap refill. SILT in M/U/R distributions. Grip 5/5 strength.  Positive Tinel's and Phalen's test.  Negative Finkelstein's test.  Neurological: He is alert.  Skin: No pallor.  Psychiatric: He has a normal mood and affect.  Nursing note and vitals reviewed.    ED Treatments / Results  Labs (all labs ordered are listed, but only abnormal results are displayed) Labs Reviewed - No data to display  EKG None  Radiology No results found.  Procedures Procedures (including critical care time) SPLINT APPLICATION Date/Time: 3:43 PM Authorized by: Jacinto HalimMichael M Navika Hoopes Consent: Verbal consent obtained. Risks and benefits: risks,  benefits and alternatives were discussed Consent given by: patient Splint applied by: orthopedic technician Location details: right Splint type: velcro splint Supplies used: velcro splint Post-procedure: The splinted body part was neurovascularly unchanged following the procedure. Patient tolerance: Patient tolerated the procedure well with no immediate complications.  Medications Ordered in ED Medications - No data to display   Initial Impression / Assessment and Plan / ED Course  I have reviewed the triage vital signs and the nursing notes.  Pertinent labs & imaging results that were available during my care of the patient were reviewed by me and considered in my medical decision making (see chart for details).     22 y.o. right handed male presenting with symptoms consistent with carpal tunnel.  There is no thenar eminence atrophy.  He has intact grip strength.  Positive Tinel's and Phalen's test.  Advised pt to have TSH level check by PCP. Pt given removable wrist splint. Will treat with naproxen (no history of kidney disease, GI bleed and patient's previous creatinine within normal limits). No concern for radiculopathy. Cryotherapy, supportive therapy discussed. Follow up with PCP. Return precautions discussed. Pt is safe for discharge at this time.   Final Clinical Impressions(s) / ED Diagnoses   Final diagnoses:  Carpal tunnel syndrome of right wrist    ED Discharge Orders        Ordered    naproxen (NAPROSYN) 375 MG tablet  2 times daily     04/16/17 1547       Jacinto HalimMaczis, Axie Hayne M, Cordelia Poche-C 04/16/17 1547    Tegeler, Canary Brimhristopher J, MD 04/16/17 [phone]

## 2017-04-16 NOTE — ED Triage Notes (Signed)
Onset 2 weeks ago developed numbness and tingling right palm of hand thumb, 1,2,3, and 4th finger.  Has abrasion left forearm 1 months ago. No drainage.

## 2017-04-16 NOTE — Discharge Instructions (Addendum)
Carpal tunnel syndrome is a condition that causes pain in your hand and arm. The carpal tunnel is a narrow area that is on the palm side of your wrist. Repeated wrist motion or certain diseases may cause swelling in the tunnel. This swelling can pinch the main nerve in the wrist (median nerve) Please follow attached handout.  Wear splint during the day and also at night when you are sleeping.  Take naproxen with food as directed.  Follow up with your primary care provider for further evaluation.

## 2017-07-03 ENCOUNTER — Emergency Department (HOSPITAL_COMMUNITY)
Admission: EM | Admit: 2017-07-03 | Discharge: 2017-07-03 | Disposition: A | Discharge status: Home / Self Care | Payer: Self-pay | Attending: Emergency Medicine | Admitting: Emergency Medicine

## 2017-07-03 ENCOUNTER — Emergency Department (HOSPITAL_COMMUNITY): Payer: Self-pay

## 2017-07-03 ENCOUNTER — Encounter (HOSPITAL_COMMUNITY): Payer: Self-pay | Admitting: Emergency Medicine

## 2017-07-03 DIAGNOSIS — R509 Fever, unspecified: Secondary | ICD-10-CM | POA: Insufficient documentation

## 2017-07-03 DIAGNOSIS — Z79899 Other long term (current) drug therapy: Secondary | ICD-10-CM | POA: Insufficient documentation

## 2017-07-03 LAB — URINALYSIS, ROUTINE W REFLEX MICROSCOPIC
Bilirubin Urine: NEGATIVE
Glucose, UA: NEGATIVE mg/dL
HGB URINE DIPSTICK: NEGATIVE
KETONES UR: NEGATIVE mg/dL
Leukocytes, UA: NEGATIVE
NITRITE: NEGATIVE
PROTEIN: NEGATIVE mg/dL
Specific Gravity, Urine: 1.003 — ABNORMAL LOW (ref 1.005–1.030)
pH: 6 (ref 5.0–8.0)

## 2017-07-03 LAB — GROUP A STREP BY PCR: GROUP A STREP BY PCR: NOT DETECTED

## 2017-07-03 MED ORDER — ONDANSETRON 4 MG PO TBDP: 4.0000 mg | ORAL_TABLET | Freq: Three times a day (TID) | ORAL | 0 refills | Status: AC | PRN

## 2017-07-03 MED ORDER — DOXYCYCLINE HYCLATE 100 MG PO CAPS: 100.0000 mg | ORAL_CAPSULE | Freq: Two times a day (BID) | ORAL | 0 refills | Status: AC

## 2017-07-03 NOTE — ED Notes (Signed)
Pt c/o sore throat and cold symptoms onset yesterday.  Pt st's today symptoms have gotten worse

## 2017-07-03 NOTE — ED Provider Notes (Signed)
[facility] Note   CSN: [id number] Arrival date & time: 07/03/17  1758     History   Chief Complaint Chief Complaint  Patient presents with  . Sore Throat  . Fever    HPI Gabriel Finley is a 22 y.o. male with a past medical history of migraines, IBS, sleep apnea, who presents today for evaluation of fever.  He reports that since yesterday he has had a sore throat, headache, chills, body aches and fever.  He reports that he has felt nauseous without vomiting.  No abdominal pain.  He does report that it feels uncomfortable when he urinates.  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  He denies any known tick bites.  His mother reports that there are multiple ticks inside the house, come in with the dogs.  HPI  History reviewed. No pertinent past medical history.  There are no active problems to display for this patient.   Past Surgical History:  Procedure Laterality Date  . TONSILLECTOMY    . WISDOM TOOTH EXTRACTION          Home Medications    Prior to Admission medications   Medication Sig Start Date End Date Taking? Authorizing Provider  Coenzyme Q10 (CO Q 10 PO) Take 1 capsule by mouth 2 (two) times daily.    [provider]  cyclobenzaprine (FLEXERIL) 10 MG tablet Take 1 tablet (10 mg total) by mouth 2 (two) times daily as needed for muscle spasms. 03/07/17   Bethel BornGekas, Kelly Marie, PA-C  doxycycline (VIBRAMYCIN) 100 MG capsule Take 1 capsule (100 mg total) by mouth 2 (two) times daily. 07/03/17   Cristina GongHammond, Lucresia Simic W, PA-C  ibuprofen (ADVIL,MOTRIN) 200 MG tablet Take 1,000 mg by mouth every 6 (six) hours as needed for headache or moderate pain.    [provider]  naproxen (NAPROSYN) 375 MG tablet Take 1 tablet (375 mg total) by mouth 2 (two) times daily. 04/16/17   Maczis, Elmer SowMichael M, PA-C  ondansetron (ZOFRAN ODT) 4 MG disintegrating tablet Take 1 tablet (4 mg total) by mouth every 8 (eight)  hours as needed for nausea or vomiting. 07/03/17   Cristina GongHammond, Kelechi Orgeron W, PA-C  ondansetron (ZOFRAN) 4 MG tablet Take 1 tablet (4 mg total) by mouth every 6 (six) hours. 10/17/16   Riki SheerYoung, Michelle G, PA-C  Red Yeast Rice Extract (RED YEAST RICE PO) Take 1 capsule by mouth 2 (two) times daily.    [provider]    Family History No family history on file.  Social History Social History   Tobacco Use  . Smoking status: Never Smoker  . Smokeless tobacco: Never Used  Substance Use Topics  . Alcohol use: No  . Drug use: No     Allergies   Nadolol; Imitrex [sumatriptan]; Morphine and related; and Red dye   Review of Systems Review of Systems  Constitutional: Positive for fatigue and fever. Negative for chills.  HENT: Positive for congestion, postnasal drip, rhinorrhea, sinus pressure and sore throat. Negative for ear discharge, ear pain, hearing loss and sinus pain.   Gastrointestinal: Positive for nausea. Negative for abdominal pain and vomiting.  Musculoskeletal: Positive for myalgias.  Neurological: Negative for headaches.  All other systems reviewed and are negative.    Physical Exam Updated Vital Signs BP 137/85 (BP Location: Right Arm)   Pulse 89   Temp (!) 101.2 F (38.4 C) (Oral)   Resp 17   SpO2 98%  Physical Exam  Constitutional: He is oriented to person, place, and time. He appears well-developed and well-nourished. No distress.  HENT:  Head: Normocephalic and atraumatic.  Right Ear: Hearing, tympanic membrane and ear canal normal.  Left Ear: Hearing, tympanic membrane and ear canal normal.  Mouth/Throat: Uvula is midline, oropharynx is clear and moist and mucous membranes are normal. Tonsils are 0 on the right. Tonsils are 0 on the left. No tonsillar exudate.  Eyes: Pupils are equal, round, and reactive to light. Conjunctivae and EOM are normal. Right eye exhibits no discharge. Left eye exhibits no discharge. No scleral icterus.  Neck: Normal range of  motion. Neck supple.  No rigidity.  Cardiovascular: Normal rate, regular rhythm, normal heart sounds and intact distal pulses.  No murmur heard. Pulmonary/Chest: Effort normal and breath sounds normal. No stridor. No respiratory distress. He exhibits no tenderness.  Abdominal: Soft. Bowel sounds are normal. He exhibits no distension.  Musculoskeletal: He exhibits no edema or deformity.  Lymphadenopathy:    He has no cervical adenopathy.  Neurological: He is alert and oriented to person, place, and time. He exhibits normal muscle tone.  Skin: Skin is warm and dry. He is not diaphoretic.  Psychiatric: He has a normal mood and affect. His behavior is normal.  Nursing note and vitals reviewed.    ED Treatments / Results  Labs (all labs ordered are listed, but only abnormal results are displayed) Labs Reviewed  URINALYSIS, ROUTINE W REFLEX MICROSCOPIC - Abnormal; Notable for the following components:      Result Value   Color, Urine COLORLESS (*)    Specific Gravity, Urine 1.003 (*)    All other components within normal limits  GROUP A STREP BY PCR  B. BURGDORFI ANTIBODIES  ROCKY MTN SPOTTED FVR ABS PNL(IGG+IGM)    EKG None  Radiology Dg Chest 2 View  Result Date: 07/03/2017 CLINICAL DATA:  Chest pain, cough and high fever EXAM: CHEST - 2 VIEW COMPARISON:  Chest x-ray dated 05/20/2016. FINDINGS: Heart size and mediastinal contours are within normal limits. Both lungs are clear. No pleural effusion. Visualized skeletal structures are unremarkable. Stable elevation of the RIGHT hemidiaphragm. IMPRESSION: No active cardiopulmonary disease.  No evidence of pneumonia. Electronically Signed   By: Bary Richard M.D.   On: 07/03/2017 20:48    Procedures Procedures (including critical care time)  Medications Ordered in ED Medications - No data to display   Initial Impression / Assessment and Plan / ED Course  I have reviewed the triage vital signs and the nursing notes.  Pertinent  labs & imaging results that were available during my care of the patient were reviewed by me and considered in my medical decision making (see chart for details).  Clinical Course as of Jul 03 2156  Fri Jul 03, 2017  1931 Patient, and waiting on strep results and then will be back.   [EH]    Clinical Course User Index [EH] Cristina Gong, PA-C   Patient presents today for evaluation of fever of unknown cause since yesterday.  He is febrile on arrival without tachycardia.  He has associated sore throat, cough, and nasal congestion.  Strep test negative.  After discussions with patient and mother given cough and reported dysuria chest x-ray obtained without acute abnormality.  Urine does not show any evidence of infection.  Based on flulike symptoms, however during the summer with possible tick exposure will send labs for RMSF and Lyme, and proactively start patient on doxycycline.  Ibuprofen  Tylenol for comfort.  Return precautions were discussed.  Not concern for meningitis based on neck supple with normal R OM.  Patient was informed that if he test positive he may need more antibiotics.  Patient and mother stated understanding of instructions including return precautions and agreeable for discharge at this time.   Final Clinical Impressions(s) / ED Diagnoses   Final diagnoses:  Fever, unspecified fever cause    ED Discharge Orders        Ordered    doxycycline (VIBRAMYCIN) 100 MG capsule  2 times daily     07/03/17 2144    ondansetron (ZOFRAN ODT) 4 MG disintegrating tablet  Every 8 hours PRN     07/03/17 2144       Cristina Gong, PA-C 07/03/17 2203    Vanetta Mulders, MD 07/05/17 1659

## 2017-07-03 NOTE — Discharge Instructions (Addendum)
Please take Ibuprofen (Advil, motrin) and Tylenol (acetaminophen) to relieve your pain.  You may take up to 600 MG (3 pills) of normal strength ibuprofen every 8 hours as needed.  In between doses of ibuprofen you make take tylenol, up to 1,000 mg (two extra strength pills).  Do not take more than 3,000 mg tylenol in a 24 hour period.  Please check all medication labels as many medications such as pain and cold medications may contain tylenol.  Do not drink alcohol while taking these medications.  Do not take other NSAID'S while taking ibuprofen (such as aleve or naproxen).  Please take ibuprofen with food to decrease stomach upset. Today your chest x-ray did not show any evidence of pneumonia, and your urine did not show any evidence of infection.  Blood testing has been [facility] and Lyme disease.  I have given you a prescription for doxycycline, this will at least service the initial treatment if you do have a tickborne illness.  This can make you more sensitive to the sun and you need to make sure you are taking appropriate precautions to avoid sunburn.  I have given you a prescription for Zofran to help with nausea and vomiting.  Please follow-up with your primary care provider Monday for a repeat evaluation.  If you develop worsening symptoms, new or concerning symptoms please seek additional medical care.  Other reasons to come back to the emergency room include neck stiffness, confusion, rashes, abnormal bruising/bleeding.   You may have diarrhea from the antibiotics.  It is very important that you continue to take the antibiotics even if you get diarrhea unless a medical professional tells you that you may stop taking them.  If you stop too early the bacteria you are being treated for will become stronger and you may need different, more powerful antibiotics that have more side effects and worsening diarrhea.  Please stay well hydrated and consider probiotics as they may  decrease the severity of your diarrhea.  Doxycycline will make you more sensitive to the sun and more likely to sunburn.  Please make sure that you are wearing long sleeve shirt and pants when outside.

## 2017-07-03 NOTE — ED Notes (Signed)
Pt to xray at this time.

## 2017-07-05 LAB — ROCKY MTN SPOTTED FVR ABS PNL(IGG+IGM)
RMSF IgG: NEGATIVE
RMSF IgM: 0.57 index (ref 0.00–0.89)

## 2017-07-06 LAB — B. BURGDORFI ANTIBODIES: B burgdorferi Ab IgG+IgM: <0.91 {ISR} (ref 0.00–0.90)

## 2018-02-21 IMAGING — DX DG LUMBAR SPINE COMPLETE 4+V
5 series · 5 of 5 positions shown · non-contrast
Comparison: None.

CLINICAL DATA: Fall 9 months ago with continued pain.

EXAM:
LUMBAR SPINE - COMPLETE 4+ VIEW

[l-spine ap]
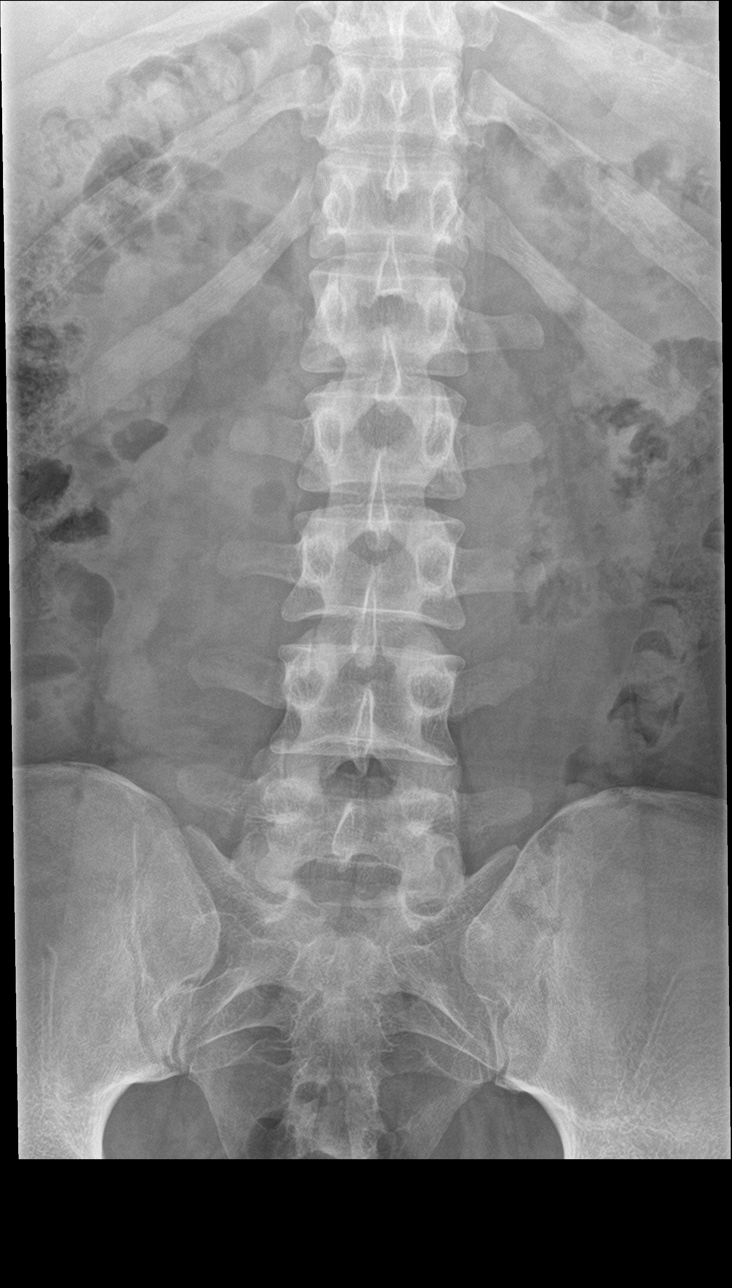

[l-spine obl (1 of 2)]
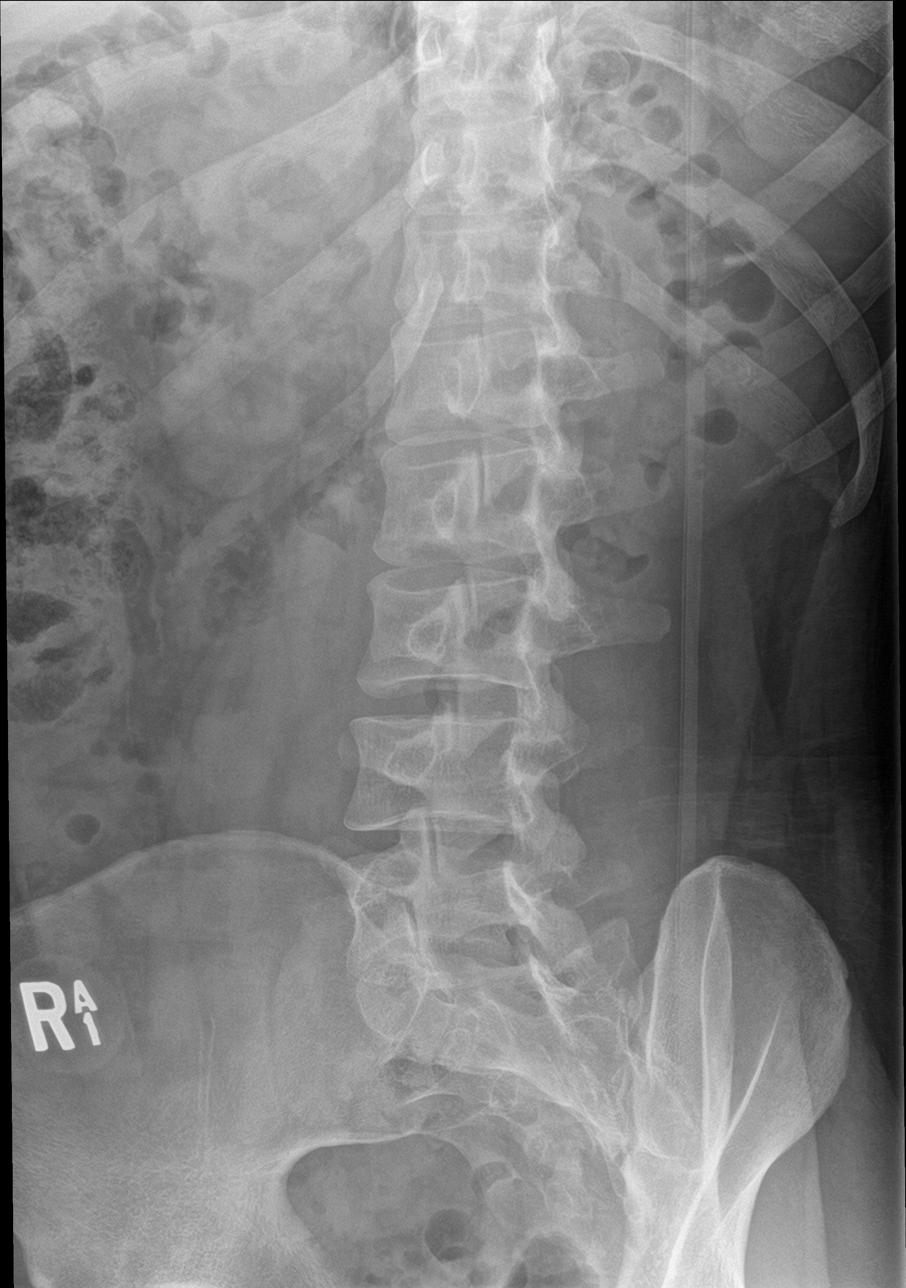

[l-spine obl (2 of 2)]
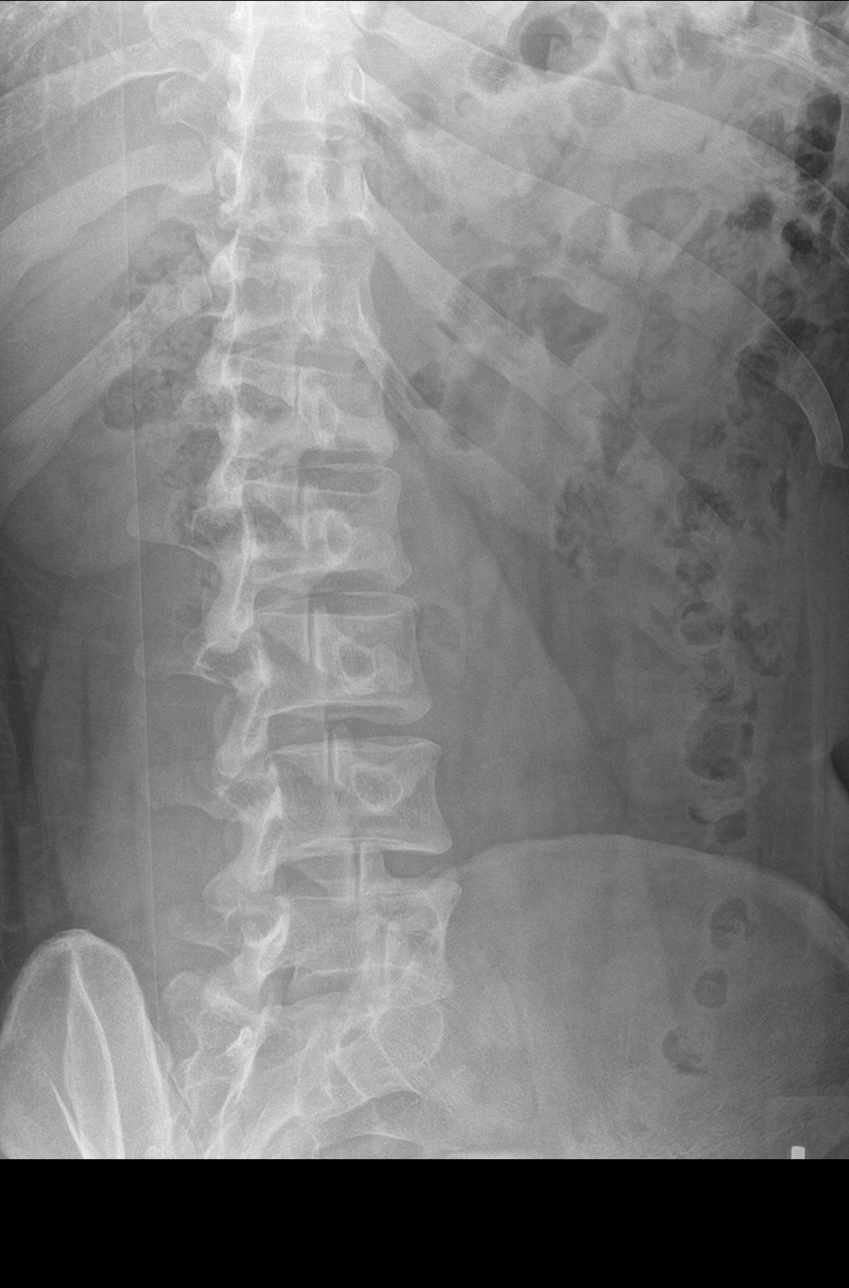

[l-spine lat]
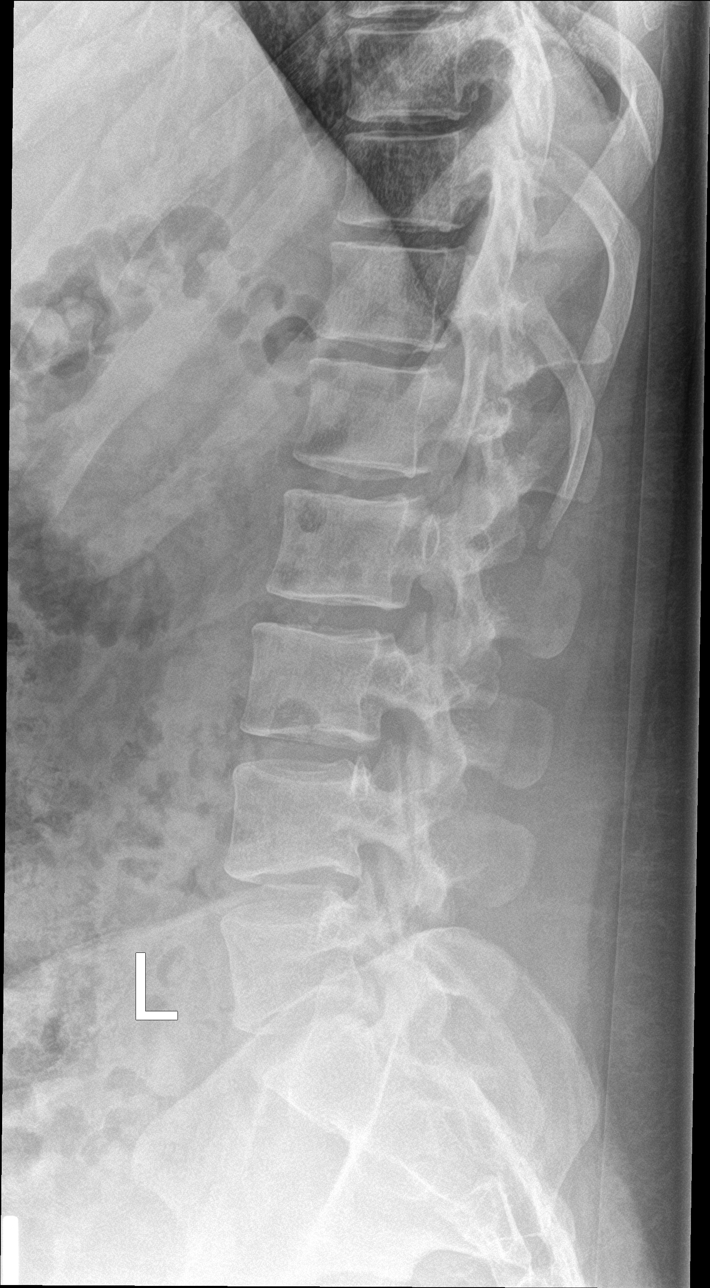

[l-spine spot]
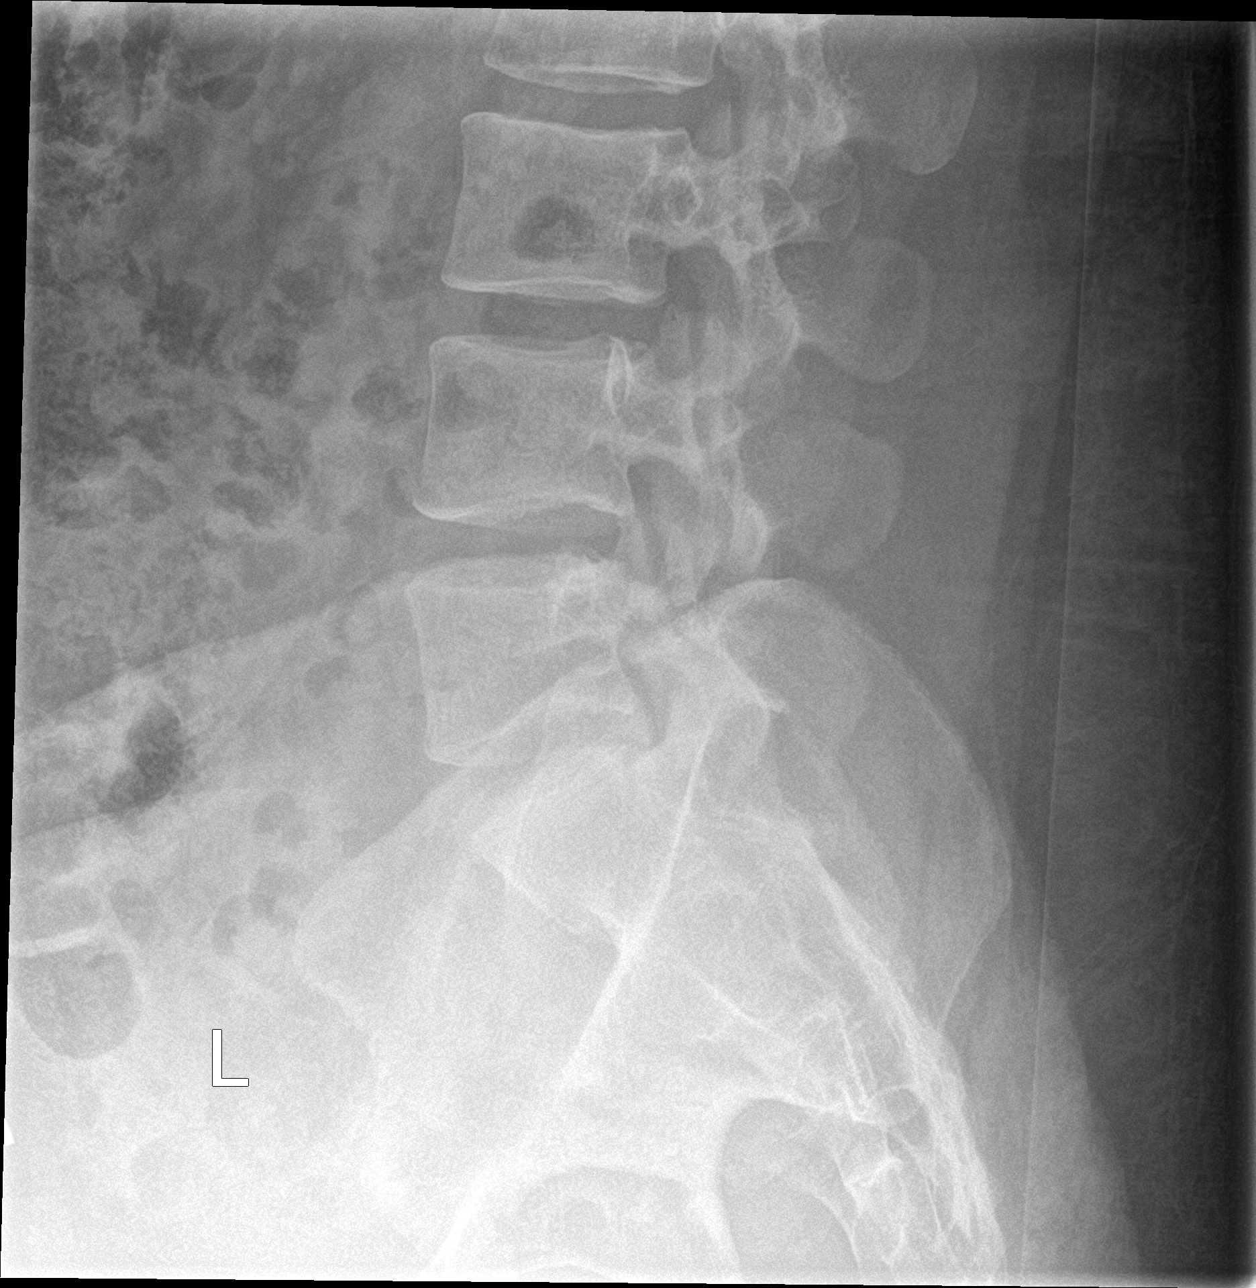

[5 of 5 positions shown; findings below may reference images not displayed]

FINDINGS: There is no evidence of lumbar spine fracture. Alignment is normal.
Intervertebral disc spaces are maintained.
IMPRESSION: Negative.

## 2018-06-19 IMAGING — CR DG CHEST 2V
2 series · 2 of 2 positions shown · non-contrast
Comparison: Chest x-ray dated 05/20/2016.

CLINICAL DATA: Chest pain, cough and high fever

EXAM:
CHEST - 2 VIEW

[chest pa]
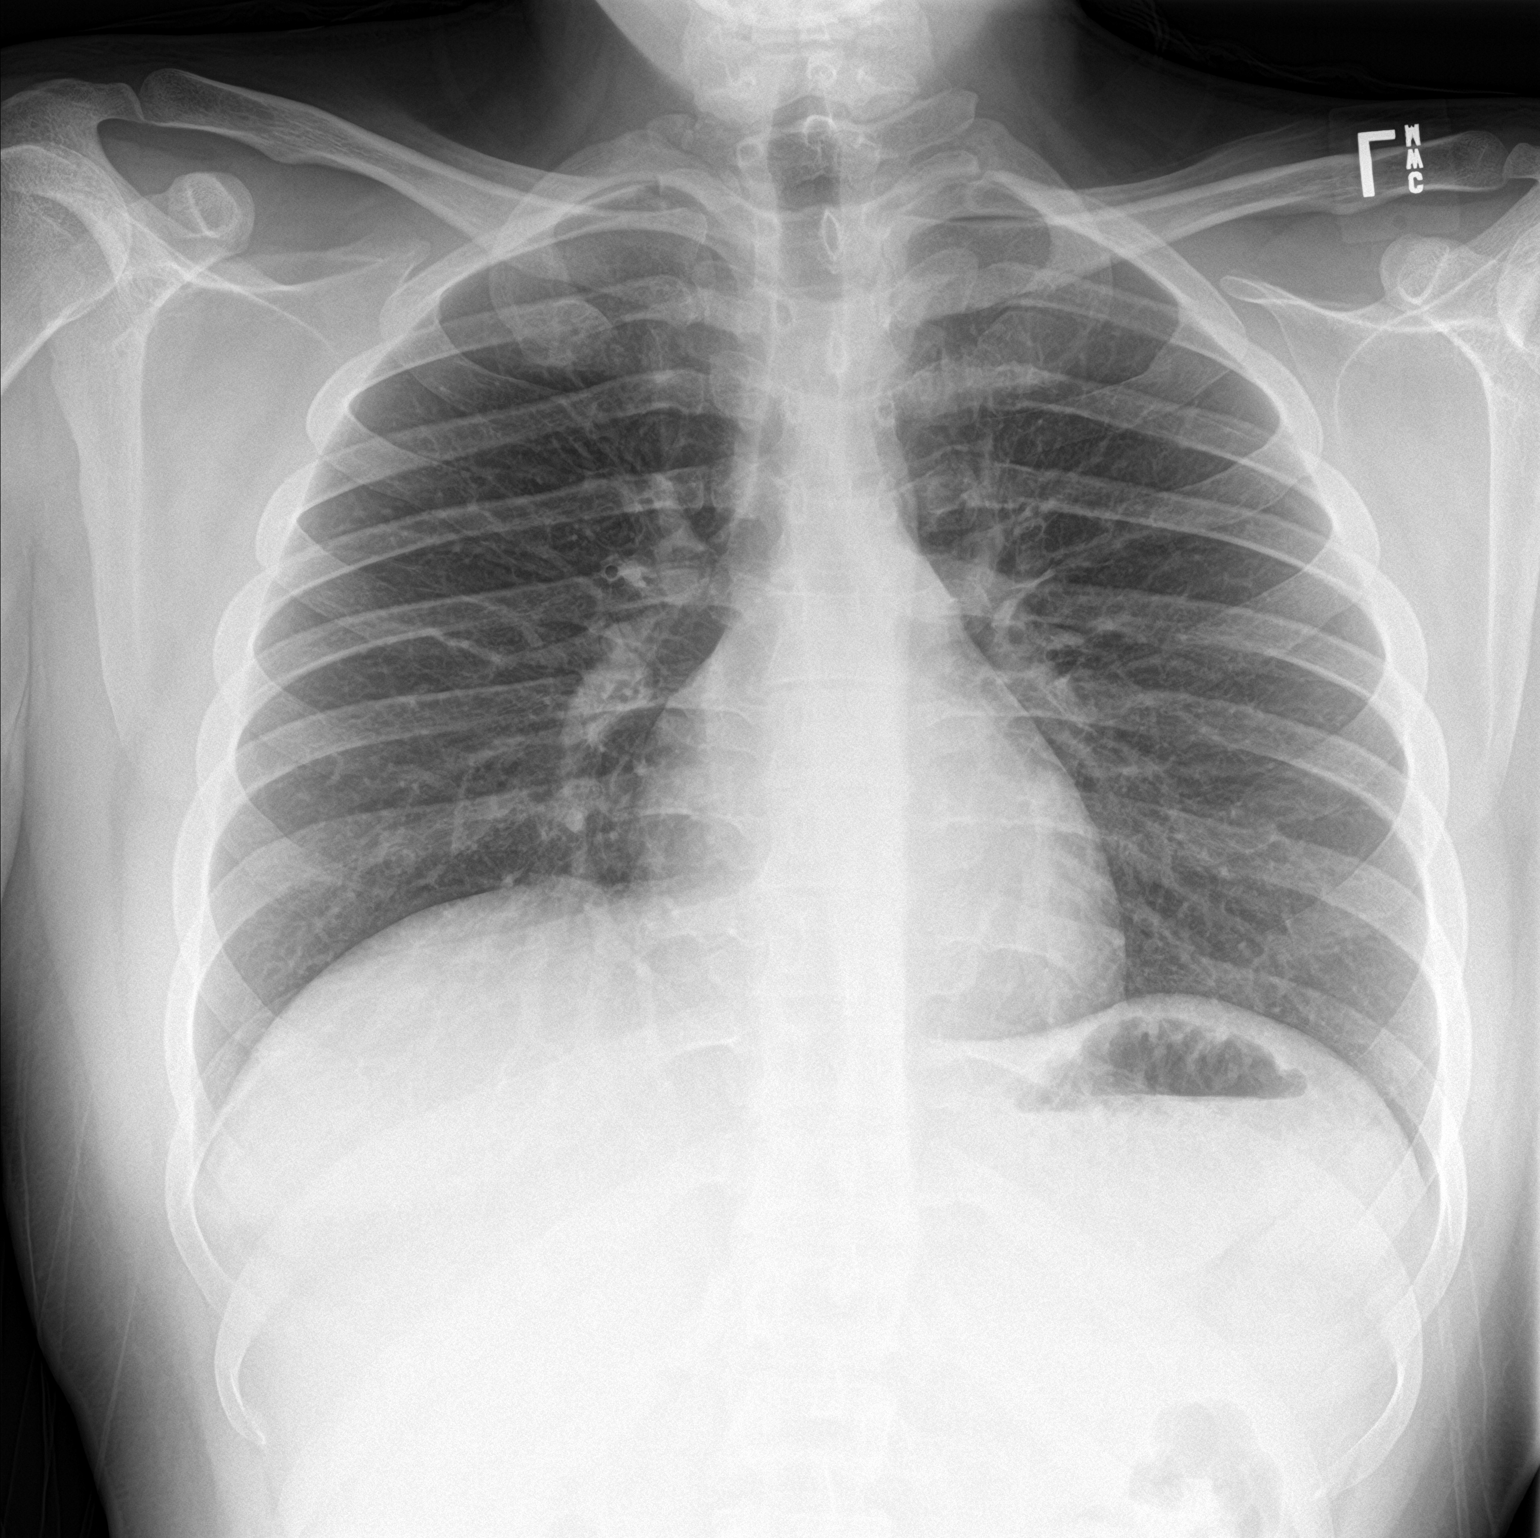

[chest lat]
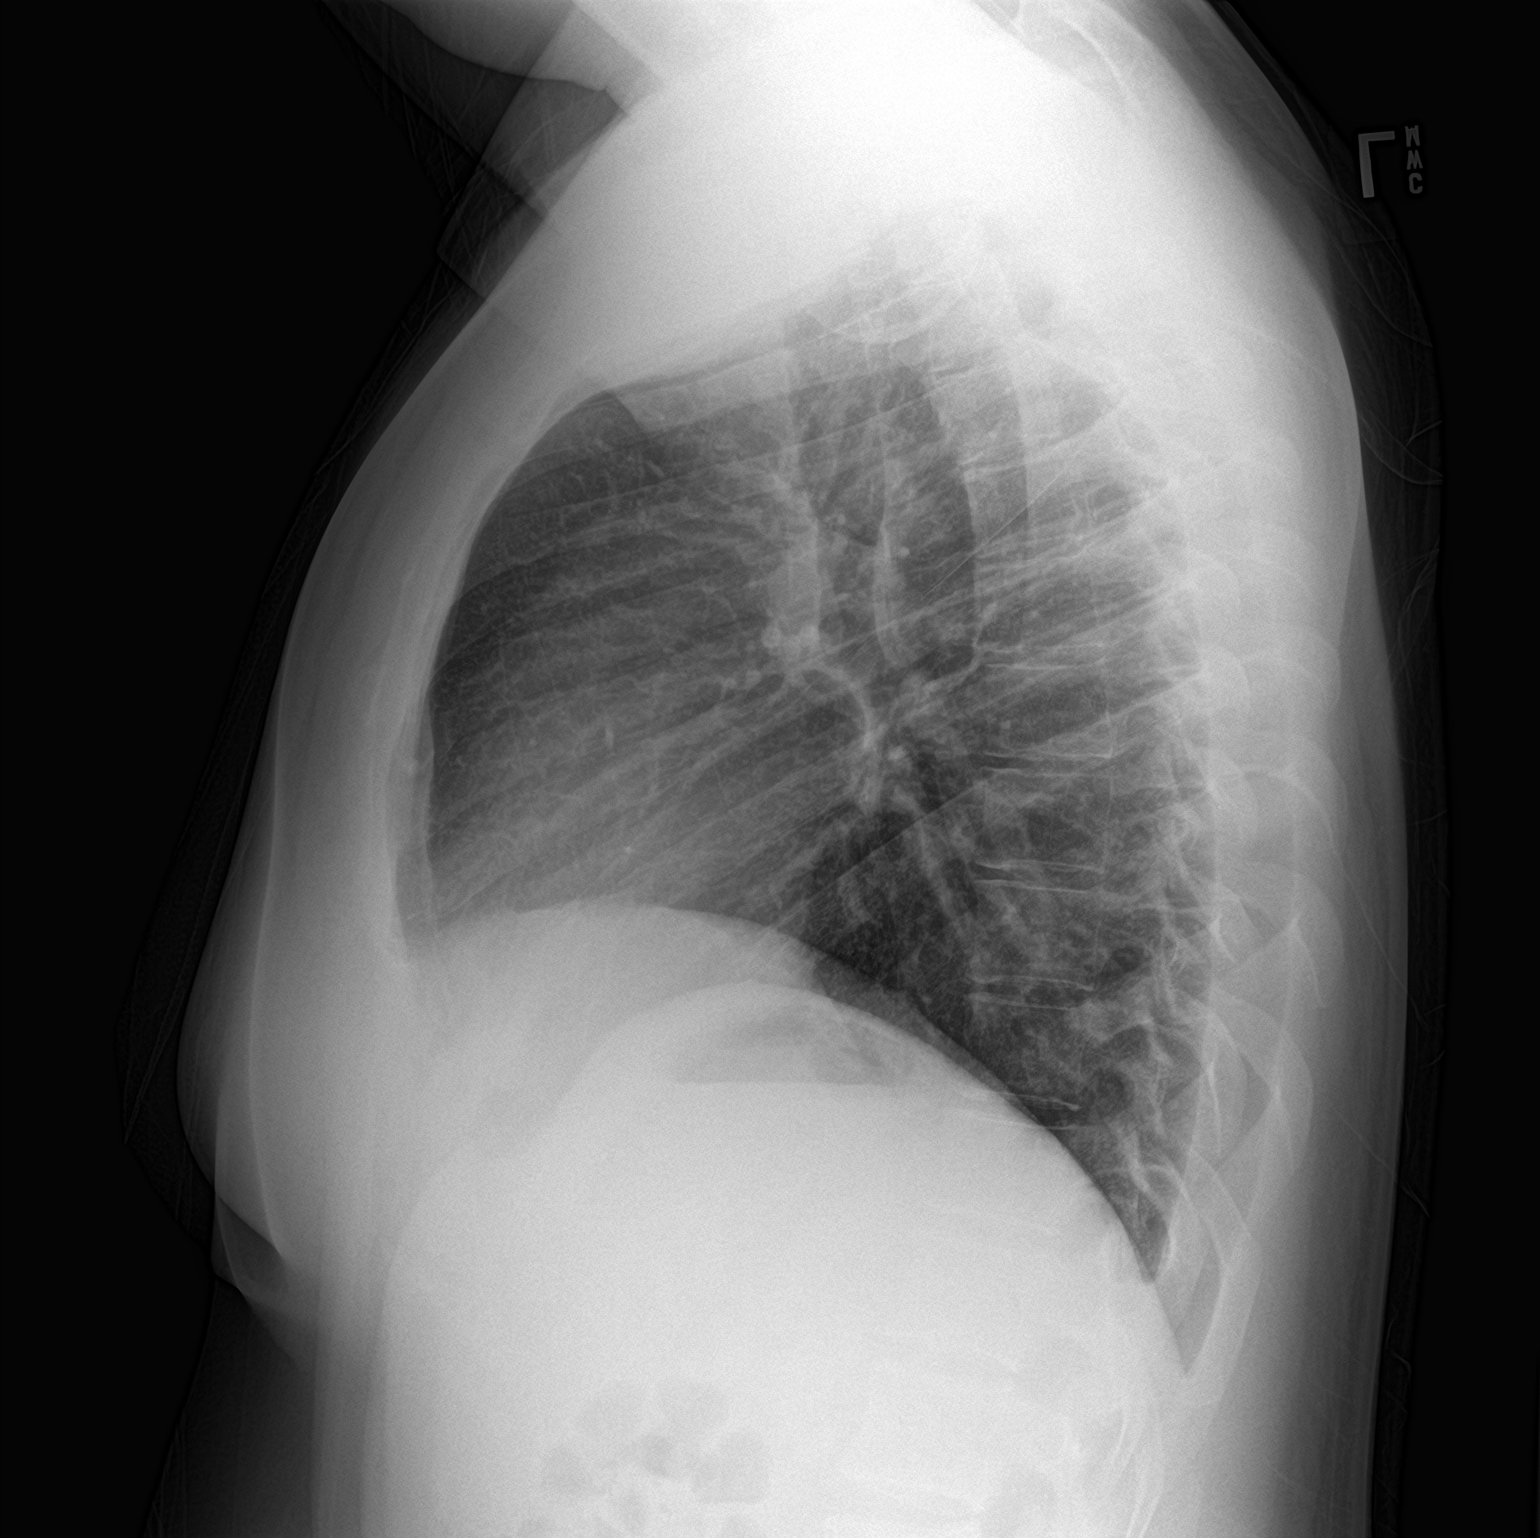

[2 of 2 positions shown; findings below may reference images not displayed]

FINDINGS: Heart size and mediastinal contours are within normal limits. Both
lungs are clear. No pleural effusion. Visualized skeletal structures
are unremarkable. Stable elevation of the RIGHT hemidiaphragm.
IMPRESSION: No active cardiopulmonary disease.  No evidence of pneumonia.

## 2019-06-28 ENCOUNTER — Ambulatory Visit (HOSPITAL_COMMUNITY): Payer: Self-pay | Admitting: Licensed Clinical Social Worker

## 2019-06-29 ENCOUNTER — Telehealth (HOSPITAL_COMMUNITY): Payer: Medicaid Other | Admitting: Psychiatry

## 2019-06-29 ENCOUNTER — Telehealth (INDEPENDENT_AMBULATORY_CARE_PROVIDER_SITE_OTHER): Payer: No Payment, Other | Admitting: Psychiatry

## 2019-06-29 ENCOUNTER — Ambulatory Visit (HOSPITAL_COMMUNITY): Payer: Self-pay | Admitting: Licensed Clinical Social Worker

## 2019-06-29 ENCOUNTER — Other Ambulatory Visit: Payer: Self-pay

## 2019-06-29 ENCOUNTER — Encounter (HOSPITAL_COMMUNITY): Payer: Self-pay | Admitting: Psychiatry

## 2019-06-29 DIAGNOSIS — F330 Major depressive disorder, recurrent, mild: Secondary | ICD-10-CM | POA: Diagnosis not present

## 2019-06-29 DIAGNOSIS — F32A Depression, unspecified: Secondary | ICD-10-CM

## 2019-06-29 MED ORDER — SERTRALINE HCL 100 MG PO TABS: 100.0000 mg | ORAL_TABLET | Freq: Every day | ORAL | 1 refills | Status: DC
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
## 2019-06-29 NOTE — Progress Notes (Signed)
SW attempted to do virtual visit with pt. Connection was bad. SW attempted to tell pt that a PC would be attempted to completed assessment. Mobile number was disconnected. Home phone went to VM HIPPAA compliant message was left to call SW back. Will await call back.

## 2019-06-30 ENCOUNTER — Other Ambulatory Visit: Payer: Self-pay

## 2019-06-30 ENCOUNTER — Ambulatory Visit (INDEPENDENT_AMBULATORY_CARE_PROVIDER_SITE_OTHER): Payer: No Payment, Other | Admitting: Licensed Clinical Social Worker

## 2019-06-30 VITALS — Ht 76.0 in | Wt 280.0 lb

## 2019-06-30 DIAGNOSIS — F331 Major depressive disorder, recurrent, moderate: Secondary | ICD-10-CM
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
## 2019-07-14 ENCOUNTER — Other Ambulatory Visit: Payer: Self-pay

## 2019-07-14 ENCOUNTER — Ambulatory Visit (HOSPITAL_COMMUNITY): Payer: Self-pay | Admitting: Licensed Clinical Social Worker

## 2019-08-29 ENCOUNTER — Other Ambulatory Visit: Payer: Self-pay

## 2019-08-29 ENCOUNTER — Encounter (HOSPITAL_COMMUNITY): Payer: Self-pay | Admitting: Psychiatry

## 2019-08-29 ENCOUNTER — Telehealth (INDEPENDENT_AMBULATORY_CARE_PROVIDER_SITE_OTHER): Payer: No Payment, Other | Admitting: Psychiatry

## 2019-08-29 DIAGNOSIS — F330 Major depressive disorder, recurrent, mild: Secondary | ICD-10-CM | POA: Diagnosis not present

## 2019-08-29 MED ORDER — SERTRALINE HCL 100 MG PO TABS: 100.0000 mg | ORAL_TABLET | Freq: Every day | ORAL | 1 refills | Status: DC

## 2019-08-29 NOTE — Progress Notes (Signed)
BH MD/PA/NP OP Progress Note Virtual Visit via Telephone Note  I connected with Gabriel Finley on 08/29/19 at  2:00 PM EDT by telephone and verified that I am speaking with the correct person using two identifiers.  Location: Patient: home Provider: Clinic   I discussed the limitations, risks, security and privacy concerns of performing an evaluation and management service by telephone and the availability of in person appointments. I also discussed with the patient that there may be a patient responsible charge related to this service. The patient expressed understanding and agreed to proceed.   I provided 30 minutes of non-face-to-face time during this encounter.      08/29/2019 2:41 PM Gabriel Finley  MRN:  [id number]  Chief Complaint: "I'm doing well"  HPI: 24 year old male seen today for followup psychiatric evaluation.  He has a psychiatric history of depression and is currently prescribed Zoloft 100 mg daily. Today he reports that he is doing well on current medication regimen.   Today he notes that he has days where he feels depressed, however he notes that he  is able to cope the symptoms.Over all he notes that he is doing well. He denies anxiety, SI/HI/VAH. At this time he request that his medications not be adjusted. He is agreeable to continue all medications as prescribed and follow up with provider in three months.       Visit Diagnosis:    ICD-10-CM   1. Mild episode of recurrent major depressive disorder (HCC)  F33.0 sertraline (ZOLOFT) 100 MG tablet    Past Psychiatric History: Depression  Past Medical History: No past medical history on file.  Past Surgical History:  Procedure Laterality Date  . TONSILLECTOMY    . WISDOM TOOTH EXTRACTION      Family Psychiatric History: Unknown  Family History: No family history on file.  Social History:  Social History   Socioeconomic History  . Marital status: Single    Spouse name: Not on file  . Number of  children: Not on file  . Years of education: Not on file  . Highest education level: Not on file  Occupational History  . Not on file  Tobacco Use  . Smoking status: Never Smoker  . Smokeless tobacco: Never Used  Substance and Sexual Activity  . Alcohol use: No  . Drug use: No  . Sexual activity: Never  Other Topics Concern  . Not on file  Social History Narrative  . Not on file   Social Determinants of Health   Financial Resource Strain: Low Risk   . Difficulty of Paying Living Expenses: Not hard at all  Food Insecurity: No Food Insecurity  . Worried About Programme researcher, broadcasting/film/video in the Last Year: Never true  . Ran Out of Food in the Last Year: Never true  Transportation Needs: No Transportation Needs  . Lack of Transportation (Medical): No  . Lack of Transportation (Non-Medical): No  Physical Activity: Insufficiently Active  . Days of Exercise per Week: 1 day  . Minutes of Exercise per Session: 30 min  Stress: Stress Concern Present  . Feeling of Stress : To some extent  Social Connections: Socially Isolated  . Frequency of Communication with Friends and Family: Twice a week  . Frequency of Social Gatherings with Friends and Family: More than three times a week  . Attends Religious Services: Never  . Active Member of Clubs or Organizations: No  . Attends Banker Meetings: Never  . Marital Status: Never married  Allergies:  Allergies  Allergen Reactions  . Nadolol Other (See Comments)    Head caving in  . Imitrex [Sumatriptan] Other (See Comments)    Running a fever  . Morphine And Related Other (See Comments)    IV pain medication - headache  . Red Dye Rash and Other (See Comments)    swedish fish    Metabolic Disorder Labs: No results found for: HGBA1C, MPG No results found for: PROLACTIN No results found for: CHOL, TRIG, HDL, CHOLHDL, VLDL, LDLCALC No results found for: TSH  Therapeutic Level Labs: No results found for: LITHIUM No results  found for: VALPROATE No components found for:  CBMZ  Current Medications: Current Outpatient Medications  Medication Sig Dispense Refill  . Coenzyme Q10 (CO Q 10 PO) Take 1 capsule by mouth 2 (two) times daily.    . cyclobenzaprine (FLEXERIL) 10 MG tablet Take 1 tablet (10 mg total) by mouth 2 (two) times daily as needed for muscle spasms. 20 tablet 0  . doxycycline (VIBRAMYCIN) 100 MG capsule Take 1 capsule (100 mg total) by mouth 2 (two) times daily. 20 capsule 0  . ibuprofen (ADVIL,MOTRIN) 200 MG tablet Take 1,000 mg by mouth every 6 (six) hours as needed for headache or moderate pain.    . naproxen (NAPROSYN) 375 MG tablet Take 1 tablet (375 mg total) by mouth 2 (two) times daily. 20 tablet 0  . ondansetron (ZOFRAN ODT) 4 MG disintegrating tablet Take 1 tablet (4 mg total) by mouth every 8 (eight) hours as needed for nausea or vomiting. 10 tablet 0  . ondansetron (ZOFRAN) 4 MG tablet Take 1 tablet (4 mg total) by mouth every 6 (six) hours. 12 tablet 0  . Red Yeast Rice Extract (RED YEAST RICE PO) Take 1 capsule by mouth 2 (two) times daily.    . sertraline (ZOLOFT) 100 MG tablet Take 1 tablet (100 mg total) by mouth daily. 60 tablet 1   No current facility-administered medications for this visit.     Musculoskeletal: Strength & Muscle Tone: Unable to assess due to telephone visit. Pt unable to log on virtually Gait & Station: Unable to assess due to telephone visit. Pt unable to log on virtually Patient leans: N/A  Psychiatric Specialty Exam: Review of Systems  There were no vitals taken for this visit.There is no height or weight on file to calculate BMI.  General Appearance: Unable to assess due to telephone visit. Pt unable to log on virtually  Eye Contact:  Unable to assess due to telephone visit. Pt unable to log on virtually  Speech:  Clear and Coherent and Normal Rate  Volume:  Normal  Mood:  Euthymic  Affect:  Congruent  Thought Process:  Coherent, Goal Directed and  Linear  Orientation:  Full (Time, Place, and Person)  Thought Content: WDL and Logical   Suicidal Thoughts:  No  Homicidal Thoughts:  No  Memory:  Immediate;   Good Recent;   Good Remote;   Good  Judgement:  Good  Insight:  Good  Psychomotor Activity:  Normal  Concentration:  Concentration: Good and Attention Span: Good  Recall:  Good  Fund of Knowledge: Good  Language: Good  Akathisia:  No  Handed:  Right  AIMS (if indicated): Not done  Assets:  Communication Skills Desire for Improvement Financial Resources/Insurance Housing Social Support  ADL's:  Intact  Cognition: WNL  Sleep:  Good   Screenings: GAD-7     Counsel[facility]/30/2019 in [facility]  Total GAD-7 Score 3    PHQ2-9     Counselor [facility] 06/30/2019 in [facility]  PHQ-2 Total Score 4  PHQ-9 Total Score 8       Assessment and Plan: Patient reports that he is doing well on current dose of Zoloft. He denies symptoms of depression or anxiety and  is agreeable to continue medications as prescribed.  1. Mild episode of recurrent major depressive disorder (HCC)  Continue- sertraline (ZOLOFT) 100 MG tablet; Take 1 tablet (100 mg total) by mouth daily.  Dispense: 60 tablet; Refill: 1 Follow-up in 3 months    Shanna Cisco, NP 08/29/2019, 2:41 PM

## 2019-11-28 ENCOUNTER — Other Ambulatory Visit: Payer: Self-pay

## 2019-11-28 ENCOUNTER — Encounter (HOSPITAL_COMMUNITY): Payer: Self-pay | Admitting: Psychiatry

## 2019-11-28 ENCOUNTER — Telehealth (INDEPENDENT_AMBULATORY_CARE_PROVIDER_SITE_OTHER): Payer: No Payment, Other | Admitting: Psychiatry

## 2019-11-28 DIAGNOSIS — F330 Major depressive disorder, recurrent, mild: Secondary | ICD-10-CM

## 2019-11-28 MED ORDER — SERTRALINE HCL 100 MG PO TABS: 100.0000 mg | ORAL_TABLET | Freq: Every day | ORAL | 2 refills | Status: DC

## 2019-11-28 NOTE — Progress Notes (Signed)
BH MD/PA/NP OP Progress Note Virtual Visit via Telephone Note  I connected with Gabriel Finley on 11/28/19 at  8:00 AM EST by telephone and verified that I am speaking with the correct person using two identifiers.  Location: Patient: home Provider: Clinic   I discussed the limitations, risks, security and privacy concerns of performing an evaluation and management service by telephone and the availability of in person appointments. I also discussed with the patient that there may be a patient responsible charge related to this service. The patient expressed understanding and agreed to proceed.   I provided 30 minutes of non-face-to-face time during this encounter.      11/28/2019 8:11 AM Gabriel Finley  MRN:  [id number]  Chief Complaint: "Things are a little better than last time"  HPI: 24 year old male seen today for followup psychiatric evaluation.  He has a psychiatric history of depression and is currently prescribed Zoloft 100 mg daily. Today he reports that he is doing well on current medication regimen.   Today is pleasant, cooperative, and engaged in conversation.  He informed provider that things have improved since his last evaluation.  He notes that he was preparing to go to work and denied symptoms of anxiety and depression.  Provider conducted a GAD-7 and patient scored a 4.  Provider also conducted a PHQ-9 and he scored a 3.  Patient informed provider that he is not sleeping as much as he would like to.  Provider asked patient how many hours he is getting his sleep.  He notes that he is getting 7 hours of sleep however notes that he still feels tired most days.  Provider asked patient if he wanted to try melatonin to help improve his sleep however he noted that he did not at this time.  He denies SI/HI/VH or paranoia.   No medication changes made today. He is agreeable to continue all medications as prescribed and follow up with provider in three months.  No other concerns  noted at this time.      Visit Diagnosis:    ICD-10-CM   1. Mild episode of recurrent major depressive disorder (HCC)  F33.0 sertraline (ZOLOFT) 100 MG tablet    Past Psychiatric History: Depression  Past Medical History: No past medical history on file.  Past Surgical History:  Procedure Laterality Date  . TONSILLECTOMY    . WISDOM TOOTH EXTRACTION      Family Psychiatric History: Unknown  Family History: No family history on file.  Social History:  Social History   Socioeconomic History  . Marital status: Single    Spouse name: Not on file  . Number of children: Not on file  . Years of education: Not on file  . Highest education level: Not on file  Occupational History  . Not on file  Tobacco Use  . Smoking status: Never Smoker  . Smokeless tobacco: Never Used  Substance and Sexual Activity  . Alcohol use: No  . Drug use: No  . Sexual activity: Never  Other Topics Concern  . Not on file  Social History Narrative  . Not on file   Social Determinants of Health   Financial Resource Strain: Low Risk   . Difficulty of Paying Living Expenses: Not hard at all  Food Insecurity: No Food Insecurity  . Worried About Programme researcher, broadcasting/film/video in the Last Year: Never true  . Ran Out of Food in the Last Year: Never true  Transportation Needs: No Transportation Needs  . Lack  of Transportation (Medical): No  . Lack of Transportation (Non-Medical): No  Physical Activity: Insufficiently Active  . Days of Exercise per Week: 1 day  . Minutes of Exercise per Session: 30 min  Stress: Stress Concern Present  . Feeling of Stress : To some extent  Social Connections: Socially Isolated  . Frequency of Communication with Friends and Family: Twice a week  . Frequency of Social Gatherings with Friends and Family: More than three times a week  . Attends Religious Services: Never  . Active Member of Clubs or Organizations: No  . Attends Banker Meetings: Never  .  Marital Status: Never married    Allergies:  Allergies  Allergen Reactions  . Nadolol Other (See Comments)    Head caving in  . Imitrex [Sumatriptan] Other (See Comments)    Running a fever  . Morphine And Related Other (See Comments)    IV pain medication - headache  . Red Dye Rash and Other (See Comments)    sw[place] fish    Metabolic Disorder Labs: No results found for: HGBA1C, MPG No results found for: PROLACTIN No results found for: CHOL, TRIG, HDL, CHOLHDL, VLDL, LDLCALC No results found for: TSH  Therapeutic Level Labs: No results found for: LITHIUM No results found for: VALPROATE No components found for:  CBMZ  Current Medications: Current Outpatient Medications  Medication Sig Dispense Refill  . Coenzyme Q10 (CO Q 10 PO) Take 1 capsule by mouth 2 (two) times daily.    . cyclobenzaprine (FLEXERIL) 10 MG tablet Take 1 tablet (10 mg total) by mouth 2 (two) times daily as needed for muscle spasms. 20 tablet 0  . doxycycline (VIBRAMYCIN) 100 MG capsule Take 1 capsule (100 mg total) by mouth 2 (two) times daily. 20 capsule 0  . ibuprofen (ADVIL,MOTRIN) 200 MG tablet Take 1,000 mg by mouth every 6 (six) hours as needed for headache or moderate pain.    . naproxen (NAPROSYN) 375 MG tablet Take 1 tablet (375 mg total) by mouth 2 (two) times daily. 20 tablet 0  . ondansetron (ZOFRAN ODT) 4 MG disintegrating tablet Take 1 tablet (4 mg total) by mouth every 8 (eight) hours as needed for nausea or vomiting. 10 tablet 0  . ondansetron (ZOFRAN) 4 MG tablet Take 1 tablet (4 mg total) by mouth every 6 (six) hours. 12 tablet 0  . Red Yeast Rice Extract (RED YEAST RICE PO) Take 1 capsule by mouth 2 (two) times daily.    . sertraline (ZOLOFT) 100 MG tablet Take 1 tablet (100 mg total) by mouth daily. 30 tablet 2   No current facility-administered medications for this visit.     Musculoskeletal: Strength & Muscle Tone: Unable to assess due to telephone visit. Pt unable to log on  virtually Gait & Station: Unable to assess due to telephone visit. Pt unable to log on virtually Patient leans: N/A  Psychiatric Specialty Exam: Review of Systems  There were no vitals taken for this visit.There is no height or weight on file to calculate BMI.  General Appearance: Unable to assess due to telephone visit. Pt unable to log on virtually  Eye Contact:  Unable to assess due to telephone visit. Pt unable to log on virtually  Speech:  Clear and Coherent and Normal Rate  Volume:  Normal  Mood:  Euthymic  Affect:  Congruent  Thought Process:  Coherent, Goal Directed and Linear  Orientation:  Full (Time, Place, and Person)  Thought Content: WDL and Logical  Suicidal Thoughts:  No  Homicidal Thoughts:  No  Memory:  Immediate;   Good Recent;   Good Remote;   Good  Judgement:  Good  Insight:  Good  Psychomotor Activity:  Normal  Concentration:  Concentration: Good and Attention Span: Good  Recall:  Good  Fund of Knowledge: Good  Language: Good  Akathisia:  No  Handed:  Right  AIMS (if indicated): Not done  Assets:  Communication Skills Desire for Improvement Financial Resources/Insurance Housing Social Support  ADL's:  Intact  Cognition: WNL  Sleep:  Good   Screenings: GAD-7     Video Visit from 11/28/2019 in [facility] from 06/30/2019 in [facility]  Total GAD-7 Score 4 3    PHQ2-9     Video Visit from 11/28/2019 in [facility] Counselor from 06/30/2019 in [facility]  PHQ-2 Total Score 0 4  PHQ-9 Total Score 3 8       Assessment and Plan: Patient reports that he is doing well on current dose of Zoloft. He denies symptoms of depression or anxiety and  is agreeable to continue medications as prescribed.  1. Mild episode of recurrent major depressive disorder (HCC)  Continue- sertraline (ZOLOFT) 100 MG tablet; Take 1 tablet (100 mg  total) by mouth daily.  Dispense: 30 tablet; Refill: 2  Follow up in 3 months    Shanna Cisco, NP 11/28/2019, 8:11 AM

## 2020-02-28 ENCOUNTER — Telehealth (HOSPITAL_COMMUNITY): Payer: No Payment, Other | Admitting: Psychiatry

## 2020-02-28 ENCOUNTER — Other Ambulatory Visit: Payer: Self-pay

## 2020-08-01 ENCOUNTER — Other Ambulatory Visit (HOSPITAL_COMMUNITY): Payer: Self-pay | Admitting: Psychiatry

## 2020-08-01 DIAGNOSIS — F330 Major depressive disorder, recurrent, mild: Secondary | ICD-10-CM

## 2020-09-12 ENCOUNTER — Other Ambulatory Visit (HOSPITAL_COMMUNITY): Payer: Self-pay | Admitting: Psychiatry

## 2020-09-12 DIAGNOSIS — F330 Major depressive disorder, recurrent, mild: Secondary | ICD-10-CM

## 2020-09-13 ENCOUNTER — Other Ambulatory Visit (HOSPITAL_COMMUNITY): Payer: Self-pay | Admitting: Psychiatry

## 2020-09-13 DIAGNOSIS — F330 Major depressive disorder, recurrent, mild: Secondary | ICD-10-CM

## 2020-11-05 ENCOUNTER — Other Ambulatory Visit (HOSPITAL_COMMUNITY): Payer: Self-pay | Admitting: Psychiatry

## 2020-11-05 DIAGNOSIS — F330 Major depressive disorder, recurrent, mild: Secondary | ICD-10-CM

## 2020-11-06 ENCOUNTER — Other Ambulatory Visit (HOSPITAL_COMMUNITY): Payer: Self-pay | Admitting: Psychiatry

## 2020-11-06 DIAGNOSIS — F330 Major depressive disorder, recurrent, mild: Secondary | ICD-10-CM

## 2020-11-07 ENCOUNTER — Other Ambulatory Visit (HOSPITAL_COMMUNITY): Payer: Self-pay | Admitting: Psychiatry

## 2020-11-07 DIAGNOSIS — F330 Major depressive disorder, recurrent, mild: Secondary | ICD-10-CM

## 2020-12-06 ENCOUNTER — Other Ambulatory Visit (HOSPITAL_COMMUNITY): Payer: Self-pay | Admitting: Psychiatry

## 2020-12-06 DIAGNOSIS — F330 Major depressive disorder, recurrent, mild: Secondary | ICD-10-CM

## 2020-12-09 ENCOUNTER — Other Ambulatory Visit (HOSPITAL_COMMUNITY): Payer: Self-pay | Admitting: Psychiatry

## 2020-12-09 DIAGNOSIS — F330 Major depressive disorder, recurrent, mild: Secondary | ICD-10-CM

## 2020-12-10 ENCOUNTER — Other Ambulatory Visit (HOSPITAL_COMMUNITY): Payer: Self-pay | Admitting: Psychiatry

## 2020-12-10 DIAGNOSIS — F330 Major depressive disorder, recurrent, mild: Secondary | ICD-10-CM

## 2021-01-04 ENCOUNTER — Other Ambulatory Visit (HOSPITAL_COMMUNITY): Payer: Self-pay | Admitting: Psychiatry
